# Patient Record
Sex: Male | Born: 1955
Health system: Southern US, Community
[De-identification: ages and names within clinical notes are randomized; demographics above are authoritative.]

## PROBLEM LIST (undated history)

## (undated) DIAGNOSIS — I499 Cardiac arrhythmia, unspecified: Secondary | ICD-10-CM

## (undated) DIAGNOSIS — E785 Hyperlipidemia, unspecified: Secondary | ICD-10-CM

## (undated) DIAGNOSIS — H9190 Unspecified hearing loss, unspecified ear: Secondary | ICD-10-CM

## (undated) DIAGNOSIS — I10 Essential (primary) hypertension: Secondary | ICD-10-CM

## (undated) DIAGNOSIS — F329 Major depressive disorder, single episode, unspecified: Secondary | ICD-10-CM

## (undated) HISTORY — DX: Unspecified hearing loss, unspecified ear: H91.90

## (undated) HISTORY — DX: Hyperlipidemia, unspecified: E78.5

## (undated) HISTORY — DX: Essential (primary) hypertension: I10

## (undated) HISTORY — DX: Major depressive disorder, single episode, unspecified: F32.9

## (undated) HISTORY — DX: Cardiac arrhythmia, unspecified: I49.9

---

## 2013-01-02 DIAGNOSIS — D649 Anemia, unspecified: Secondary | ICD-10-CM

## 2013-01-02 DIAGNOSIS — R002 Palpitations: Secondary | ICD-10-CM

## 2013-01-02 DIAGNOSIS — N12 Tubulo-interstitial nephritis, not specified as acute or chronic: Secondary | ICD-10-CM

## 2013-01-02 DIAGNOSIS — F32A Depression, unspecified: Secondary | ICD-10-CM

## 2013-01-02 DIAGNOSIS — N200 Calculus of kidney: Secondary | ICD-10-CM | POA: Insufficient documentation

## 2013-01-02 DIAGNOSIS — N419 Inflammatory disease of prostate, unspecified: Secondary | ICD-10-CM

## 2013-01-02 HISTORY — DX: Tubulo-interstitial nephritis, not specified as acute or chronic: N12

## 2013-01-02 HISTORY — DX: Palpitations: R00.2

## 2013-01-02 HISTORY — DX: Calculus of kidney: N20.0

## 2013-01-02 HISTORY — DX: Inflammatory disease of prostate, unspecified: N41.9

## 2013-01-02 HISTORY — DX: Depression, unspecified: F32.A

## 2013-01-02 HISTORY — DX: Anemia, unspecified: D64.9

## 2013-01-04 DIAGNOSIS — N400 Enlarged prostate without lower urinary tract symptoms: Secondary | ICD-10-CM

## 2013-01-04 HISTORY — DX: Benign prostatic hyperplasia without lower urinary tract symptoms: N40.0

## 2013-07-02 DIAGNOSIS — F419 Anxiety disorder, unspecified: Secondary | ICD-10-CM

## 2013-07-02 HISTORY — DX: Anxiety disorder, unspecified: F41.9

## 2018-12-09 DIAGNOSIS — I10 Essential (primary) hypertension: Secondary | ICD-10-CM | POA: Diagnosis not present

## 2018-12-09 DIAGNOSIS — Z79899 Other long term (current) drug therapy: Secondary | ICD-10-CM | POA: Diagnosis not present

## 2018-12-09 DIAGNOSIS — Z0000 Encounter for general adult medical examination without abnormal findings: Secondary | ICD-10-CM | POA: Diagnosis not present

## 2018-12-09 DIAGNOSIS — E785 Hyperlipidemia, unspecified: Secondary | ICD-10-CM | POA: Diagnosis not present

## 2019-03-03 DIAGNOSIS — Z79899 Other long term (current) drug therapy: Secondary | ICD-10-CM | POA: Diagnosis not present

## 2019-03-03 DIAGNOSIS — Z9181 History of falling: Secondary | ICD-10-CM | POA: Diagnosis not present

## 2019-03-31 ENCOUNTER — Other Ambulatory Visit: Payer: Self-pay

## 2019-03-31 DIAGNOSIS — T466X5A Adverse effect of antihyperlipidemic and antiarteriosclerotic drugs, initial encounter: Secondary | ICD-10-CM | POA: Diagnosis not present

## 2019-03-31 DIAGNOSIS — E785 Hyperlipidemia, unspecified: Secondary | ICD-10-CM | POA: Diagnosis not present

## 2019-03-31 DIAGNOSIS — Z79899 Other long term (current) drug therapy: Secondary | ICD-10-CM | POA: Diagnosis not present

## 2019-03-31 NOTE — Patient Outreach (Signed)
[facility]) Care Management  03/31/2019  LEUM TOMAN 03/09/1956 [id number]   Medication Adherence call to Mr. Christella Hartigan patient has a disconnected Number,patient is past due on Atorvastatin 20 mg under [place].   [facility] [place] Management Direct Dial [phone]  Fax [phone] [email or web address]

## 2019-05-05 DIAGNOSIS — Z79899 Other long term (current) drug therapy: Secondary | ICD-10-CM | POA: Diagnosis not present

## 2019-05-05 DIAGNOSIS — Z87442 Personal history of urinary calculi: Secondary | ICD-10-CM | POA: Diagnosis not present

## 2019-06-23 DIAGNOSIS — Z87442 Personal history of urinary calculi: Secondary | ICD-10-CM | POA: Diagnosis not present

## 2019-06-23 DIAGNOSIS — Z79899 Other long term (current) drug therapy: Secondary | ICD-10-CM | POA: Diagnosis not present

## 2019-07-28 DIAGNOSIS — Z79899 Other long term (current) drug therapy: Secondary | ICD-10-CM | POA: Diagnosis not present

## 2019-07-28 DIAGNOSIS — E785 Hyperlipidemia, unspecified: Secondary | ICD-10-CM | POA: Diagnosis not present

## 2019-09-02 DIAGNOSIS — Z79899 Other long term (current) drug therapy: Secondary | ICD-10-CM | POA: Diagnosis not present

## 2019-09-02 DIAGNOSIS — H833X3 Noise effects on inner ear, bilateral: Secondary | ICD-10-CM | POA: Diagnosis not present

## 2019-09-02 DIAGNOSIS — H9193 Unspecified hearing loss, bilateral: Secondary | ICD-10-CM | POA: Diagnosis not present

## 2019-09-02 DIAGNOSIS — H9313 Tinnitus, bilateral: Secondary | ICD-10-CM | POA: Diagnosis not present

## 2019-09-14 DIAGNOSIS — R1012 Left upper quadrant pain: Secondary | ICD-10-CM | POA: Diagnosis not present

## 2019-09-14 DIAGNOSIS — Z79899 Other long term (current) drug therapy: Secondary | ICD-10-CM | POA: Diagnosis not present

## 2019-09-14 DIAGNOSIS — Z87442 Personal history of urinary calculi: Secondary | ICD-10-CM | POA: Diagnosis not present

## 2019-09-14 DIAGNOSIS — N200 Calculus of kidney: Secondary | ICD-10-CM | POA: Diagnosis not present

## 2019-09-14 DIAGNOSIS — R1011 Right upper quadrant pain: Secondary | ICD-10-CM | POA: Diagnosis not present

## 2019-09-21 DIAGNOSIS — I700 Atherosclerosis of aorta: Secondary | ICD-10-CM | POA: Diagnosis not present

## 2019-09-21 DIAGNOSIS — K5730 Diverticulosis of large intestine without perforation or abscess without bleeding: Secondary | ICD-10-CM | POA: Diagnosis not present

## 2019-09-21 DIAGNOSIS — N200 Calculus of kidney: Secondary | ICD-10-CM | POA: Diagnosis not present

## 2019-09-22 DIAGNOSIS — N200 Calculus of kidney: Secondary | ICD-10-CM | POA: Diagnosis not present

## 2019-09-22 DIAGNOSIS — R1011 Right upper quadrant pain: Secondary | ICD-10-CM | POA: Diagnosis not present

## 2019-09-22 DIAGNOSIS — R1012 Left upper quadrant pain: Secondary | ICD-10-CM | POA: Diagnosis not present

## 2019-10-11 DIAGNOSIS — N200 Calculus of kidney: Secondary | ICD-10-CM | POA: Diagnosis not present

## 2019-10-11 DIAGNOSIS — Z77122 Contact with and (suspected) exposure to noise: Secondary | ICD-10-CM | POA: Diagnosis not present

## 2019-10-11 DIAGNOSIS — H9313 Tinnitus, bilateral: Secondary | ICD-10-CM | POA: Diagnosis not present

## 2019-10-11 DIAGNOSIS — H903 Sensorineural hearing loss, bilateral: Secondary | ICD-10-CM | POA: Diagnosis not present

## 2019-10-13 DIAGNOSIS — N200 Calculus of kidney: Secondary | ICD-10-CM | POA: Diagnosis not present

## 2019-10-19 DIAGNOSIS — N200 Calculus of kidney: Secondary | ICD-10-CM | POA: Diagnosis not present

## 2019-10-20 DIAGNOSIS — Z1159 Encounter for screening for other viral diseases: Secondary | ICD-10-CM | POA: Diagnosis not present

## 2019-10-20 DIAGNOSIS — N200 Calculus of kidney: Secondary | ICD-10-CM | POA: Diagnosis not present

## 2019-10-22 DIAGNOSIS — N200 Calculus of kidney: Secondary | ICD-10-CM | POA: Diagnosis not present

## 2019-10-28 DIAGNOSIS — N200 Calculus of kidney: Secondary | ICD-10-CM | POA: Diagnosis not present

## 2019-10-29 DIAGNOSIS — N200 Calculus of kidney: Secondary | ICD-10-CM | POA: Diagnosis not present

## 2019-11-17 DIAGNOSIS — I10 Essential (primary) hypertension: Secondary | ICD-10-CM | POA: Diagnosis not present

## 2019-11-17 DIAGNOSIS — Z79899 Other long term (current) drug therapy: Secondary | ICD-10-CM | POA: Diagnosis not present

## 2019-11-17 DIAGNOSIS — E785 Hyperlipidemia, unspecified: Secondary | ICD-10-CM | POA: Diagnosis not present

## 2019-12-15 DIAGNOSIS — Z0000 Encounter for general adult medical examination without abnormal findings: Secondary | ICD-10-CM | POA: Diagnosis not present

## 2019-12-15 DIAGNOSIS — Z9181 History of falling: Secondary | ICD-10-CM | POA: Diagnosis not present

## 2019-12-15 DIAGNOSIS — E785 Hyperlipidemia, unspecified: Secondary | ICD-10-CM | POA: Diagnosis not present

## 2020-01-27 DIAGNOSIS — H43393 Other vitreous opacities, bilateral: Secondary | ICD-10-CM | POA: Diagnosis not present

## 2020-01-27 DIAGNOSIS — H2513 Age-related nuclear cataract, bilateral: Secondary | ICD-10-CM | POA: Diagnosis not present

## 2020-03-15 DIAGNOSIS — Z79899 Other long term (current) drug therapy: Secondary | ICD-10-CM | POA: Diagnosis not present

## 2020-03-15 DIAGNOSIS — E785 Hyperlipidemia, unspecified: Secondary | ICD-10-CM | POA: Diagnosis not present

## 2020-03-15 DIAGNOSIS — I10 Essential (primary) hypertension: Secondary | ICD-10-CM | POA: Diagnosis not present

## 2020-03-15 DIAGNOSIS — Z139 Encounter for screening, unspecified: Secondary | ICD-10-CM | POA: Diagnosis not present

## 2020-07-17 DIAGNOSIS — I499 Cardiac arrhythmia, unspecified: Secondary | ICD-10-CM | POA: Diagnosis not present

## 2020-07-17 DIAGNOSIS — I10 Essential (primary) hypertension: Secondary | ICD-10-CM | POA: Diagnosis not present

## 2020-07-17 DIAGNOSIS — Z79899 Other long term (current) drug therapy: Secondary | ICD-10-CM | POA: Diagnosis not present

## 2020-07-17 DIAGNOSIS — E785 Hyperlipidemia, unspecified: Secondary | ICD-10-CM | POA: Diagnosis not present

## 2020-07-19 ENCOUNTER — Encounter: Payer: Self-pay | Admitting: Cardiology

## 2020-07-19 ENCOUNTER — Encounter: Payer: Self-pay | Admitting: *Deleted

## 2020-07-19 DIAGNOSIS — I10 Essential (primary) hypertension: Secondary | ICD-10-CM | POA: Insufficient documentation

## 2020-07-19 DIAGNOSIS — I499 Cardiac arrhythmia, unspecified: Secondary | ICD-10-CM | POA: Insufficient documentation

## 2020-07-19 DIAGNOSIS — E785 Hyperlipidemia, unspecified: Secondary | ICD-10-CM | POA: Insufficient documentation

## 2020-07-19 DIAGNOSIS — F329 Major depressive disorder, single episode, unspecified: Secondary | ICD-10-CM | POA: Insufficient documentation

## 2020-08-07 ENCOUNTER — Ambulatory Visit: Payer: Self-pay | Admitting: Cardiology

## 2020-08-08 ENCOUNTER — Other Ambulatory Visit: Payer: Self-pay

## 2020-08-08 ENCOUNTER — Ambulatory Visit (INDEPENDENT_AMBULATORY_CARE_PROVIDER_SITE_OTHER): Payer: Medicare Other

## 2020-08-08 ENCOUNTER — Ambulatory Visit: Payer: Medicare Other | Admitting: Cardiology

## 2020-08-08 ENCOUNTER — Encounter: Payer: Self-pay | Admitting: Cardiology

## 2020-08-08 VITALS — BP 152/80 | HR 58 | Ht 68.0 in | Wt 152.4 lb

## 2020-08-08 DIAGNOSIS — R002 Palpitations: Secondary | ICD-10-CM

## 2020-08-08 DIAGNOSIS — R0602 Shortness of breath: Secondary | ICD-10-CM

## 2020-08-08 DIAGNOSIS — I10 Essential (primary) hypertension: Secondary | ICD-10-CM | POA: Diagnosis not present

## 2020-08-08 MED ORDER — HYDROCHLOROTHIAZIDE 12.5 MG PO CAPS: 12.5000 mg | ORAL_CAPSULE | Freq: Every day | ORAL | 3 refills | Status: DC

## 2020-08-08 NOTE — Patient Instructions (Addendum)
Medication Instructions:  Your physician has recommended you make the following change in your medication: START: Hydrochlorothiazide 12.5 mg daily  *If you need a refill on your cardiac medications before your next appointment, please call your pharmacy*   Lab Work: None If you have labs (blood work) drawn today and your tests are completely normal, you will receive your results only by: Marland Kitchen MyChart Message (if you have MyChart) OR . A paper copy in the mail If you have any lab test that is abnormal or we need to change your treatment, we will call you to review the results.   Testing/Procedures: Your physician has requested that you have an echocardiogram. Echocardiography is a painless test that uses sound waves to create images of your heart. It provides your doctor with information about the size and shape of your heart and how well your heart's chambers and valves are working. This procedure takes approximately one hour. There are no restrictions for this procedure.  A zio monitor was ordered today. It will remain on for 14 days. You will then return monitor and event diary in provided box. It takes 1-2 weeks for report to be downloaded and returned to [place]. We will call you with the results. If monitor falls off or has orange flashing light, please call Zio for further instructions.      Follow-Up: At [facility], you and your health needs are our priority.  As part of our continuing mission to provide you with exceptional heart care, we have created designated Provider Care Teams.  These Care Teams include your primary Cardiologist (physician) and Advanced Practice Providers (APPs -  Physician Assistants and Nurse Practitioners) who all work together to provide you with the care you need, when you need it.  We recommend signing up for the patient portal called "MyChart".  Sign up information is provided on this After Visit Summary.  MyChart is used to connect with patients for  Virtual Visits (Telemedicine).  Patients are able to view lab/test results, encounter notes, upcoming appointments, etc.  Non-urgent messages can be sent to your provider as well.   To learn more about what you can do with MyChart, go to [email or web address].    Your next appointment:   3 month(s)  The format for your next appointment:   In Person  Provider:   Berniece Salines, DO   Other Instructions

## 2020-08-08 NOTE — Progress Notes (Signed)
Cardiology Office Note:    Date:  08/08/2020   ID:  Christopher Duarte, DOB 04-22-1956, MRN [id number]  PCP:  Helen Hashimoto., MD  Cardiologist:  Berniece Salines, DO  Electrophysiologist:  None   Referring MD: Helen Hashimoto., MD   Have been having palpitations and I feel like my heart is beating irregularly  History of Present Illness:    Christopher Duarte is a 65 y.o. male with a hx of hypertension, hyperlipidemia, BPH is here today to be evaluated at the request of his PCP.  The patient tells me that he has been experiencing intermittent palpitations.  He notes that he feels his heart is irregular a stress of [place] at that time he feels lightheaded and dizzy.  Sometimes he feels that he is short of breath which gets better.  He has had chest tightness during the time of his palpitations he has not had any syncope episode.  Past Medical History:  Diagnosis Date  . Anemia 01/02/2013  . Anxiety 07/02/2013  . Arrhythmia   . Benign prostatic hyperplasia 01/04/2013  . Depression 01/02/2013  . Hyperlipidemia   . Hypertension   . Kidney stones 01/02/2013  . Major depression   . Palpitation 01/02/2013  . Prostatitis 01/02/2013  . Pyelonephritis 01/02/2013    History reviewed. No pertinent surgical history.  Current Medications: Current Meds  Medication Sig  . atorvastatin (LIPITOR) 10 MG tablet Take 10 mg by mouth daily.  . cyclobenzaprine (FLEXERIL) 10 MG tablet Take 10 mg by mouth as needed for pain.  . hydrochlorothiazide (MICROZIDE) 12.5 MG capsule Take 1 capsule (12.5 mg total) by mouth daily.  . metoprolol tartrate (LOPRESSOR) 25 MG tablet Take 25 mg by mouth 2 (two) times daily.  . QUEtiapine Fumarate (SEROQUEL XR) 150 MG 24 hr tablet Take 150 mg by mouth at bedtime.  . [DISCONTINUED] aspirin EC 81 MG tablet Take 81 mg by mouth daily. Swallow whole.  . [DISCONTINUED] Multiple Vitamin (MULTIVITAMIN) tablet Take 1 tablet by mouth daily.     Allergies:   Patient has no known  allergies.   Social History   Socioeconomic History  . Marital status: Married    Spouse name: Not on file  . Number of children: Not on file  . Years of education: Not on file  . Highest education level: Not on file  Occupational History  . Not on file  Tobacco Use  . Smoking status: Never Smoker  . Smokeless tobacco: Not on file  Substance and Sexual Activity  . Alcohol use: Not on file    Comment: Occasional  . Drug use: Never  . Sexual activity: Not on file  Other Topics Concern  . Not on file  Social History Narrative  . Not on file   Social Determinants of Health   Financial Resource Strain: Not on file  Food Insecurity: Not on file  Transportation Needs: Not on file  Physical Activity: Not on file  Stress: Not on file  Social Connections: Not on file     Family History: The patient's family history includes Prostate cancer in his father; Skin cancer in his mother.  ROS:   Review of Systems  Constitution: Negative for decreased appetite, fever and weight gain.  HENT: Negative for congestion, ear discharge, hoarse voice and sore throat.   Eyes: Negative for discharge, redness, vision loss in right eye and visual halos.  Cardiovascular: Negative for chest pain, dyspnea on exertion, leg swelling, orthopnea and palpitations.  Respiratory: Negative  for cough, hemoptysis, shortness of breath and snoring.   Endocrine: Negative for heat intolerance and polyphagia.  Hematologic/Lymphatic: Negative for bleeding problem. Does not bruise/bleed easily.  Skin: Negative for flushing, nail changes, rash and suspicious lesions.  Musculoskeletal: Negative for arthritis, joint pain, muscle cramps, myalgias, neck pain and stiffness.  Gastrointestinal: Negative for abdominal pain, bowel incontinence, diarrhea and excessive appetite.  Genitourinary: Negative for decreased libido, genital sores and incomplete emptying.  Neurological: Negative for brief paralysis, focal weakness,  headaches and loss of balance.  Psychiatric/Behavioral: Negative for altered mental status, depression and suicidal ideas.  Allergic/Immunologic: Negative for HIV exposure and persistent infections.    EKGs/Labs/Other Studies Reviewed:    The following studies were reviewed today:   EKG:  The ekg ordered today demonstrates sinus bradycardia, heart rate 58 bpm with incomplete right bundle branch block.  Recent Labs: No results found for requested labs within last 8760 hours.  Recent Lipid Panel No results found for: CHOL, TRIG, HDL, CHOLHDL, VLDL, LDLCALC, LDLDIRECT  Physical Exam:    VS:  BP (!) 152/80 (BP Location: Right Arm)   Pulse (!) 58   Ht 5\' 8"  (1.727 m)   Wt 152 lb 6.4 oz (69.1 kg)   SpO2 98%   BMI 23.17 kg/m     Wt Readings from Last 3 Encounters:  08/08/20 152 lb 6.4 oz (69.1 kg)  07/17/20 149 lb (67.6 kg)     GEN: Well nourished, well developed in no acute distress HEENT: Normal NECK: No JVD; No carotid bruits LYMPHATICS: No lymphadenopathy CARDIAC: S1S2 noted,RRR, no murmurs, rubs, gallops RESPIRATORY:  Clear to auscultation without rales, wheezing or rhonchi  ABDOMEN: Soft, non-tender, non-distended, +bowel sounds, no guarding. EXTREMITIES: No edema, No cyanosis, no clubbing MUSCULOSKELETAL:  No deformity  SKIN: Warm and dry NEUROLOGIC:  Alert and oriented x 3, non-focal PSYCHIATRIC:  Normal affect, good insight  ASSESSMENT:    1. Palpitations   2. Shortness of breath   3. Hypertension, unspecified type    PLAN:     I would like to rule out a cardiovascular etiology of this palpitation, therefore at this time I would like to placed a zio patch for 14 days. For his shortness of breath, a transthoracic echocardiogram will be ordered to assess LV/RV function and any structural abnormalities.   He is hypertensive in the office today.  I like to add hydrochlorothiazide 0.5 mg that he is current regimen which includes metoprolol tartrate 25 mg twice a  day.  Hyperlipidemia - continue with current statin medication.  The patient is in agreement with the above plan. The patient left the office in stable condition.  The patient will follow up in 3 months or sooner if needed.   Medication Adjustments/Labs and Tests Ordered: Current medicines are reviewed at length with the patient today.  Concerns regarding medicines are outlined above.  Orders Placed This Encounter  Procedures  . LONG TERM MONITOR (3-14 DAYS)  . EKG 12-Lead  . ECHOCARDIOGRAM COMPLETE   Meds ordered this encounter  Medications  . hydrochlorothiazide (MICROZIDE) 12.5 MG capsule    Sig: Take 1 capsule (12.5 mg total) by mouth daily.    Dispense:  90 capsule    Refill:  3    Patient Instructions  Medication Instructions:  Your physician has recommended you make the following change in your medication: START: Hydrochlorothiazide 12.5 mg daily  *If you need a refill on your cardiac medications before your next appointment, please call your pharmacy*   Lab  Work: None If you have labs (blood work) drawn today and your tests are completely normal, you will receive your results only by: Marland Kitchen MyChart Message (if you have MyChart) OR . A paper copy in the mail If you have any lab test that is abnormal or we need to change your treatment, we will call you to review the results.   Testing/Procedures: Your physician has requested that you have an echocardiogram. Echocardiography is a painless test that uses sound waves to create images of your heart. It provides your doctor with information about the size and shape of your heart and how well your heart's chambers and valves are working. This procedure takes approximately one hour. There are no restrictions for this procedure.  A zio monitor was ordered today. It will remain on for 14 days. You will then return monitor and event diary in provided box. It takes 1-2 weeks for report to be downloaded and returned to [place]. We will  call you with the results. If monitor falls off or has orange flashing light, please call Zio for further instructions.      Follow-Up: At [facility], you and your health needs are our priority.  As part of our continuing mission to provide you with exceptional heart care, we have created designated Provider Care Teams.  These Care Teams include your primary Cardiologist (physician) and Advanced Practice Providers (APPs -  Physician Assistants and Nurse Practitioners) who all work together to provide you with the care you need, when you need it.  We recommend signing up for the patient portal called "MyChart".  Sign up information is provided on this After Visit Summary.  MyChart is used to connect with patients for Virtual Visits (Telemedicine).  Patients are able to view lab/test results, encounter notes, upcoming appointments, etc.  Non-urgent messages can be sent to your provider as well.   To learn more about what you can do with MyChart, go to [email or web address].    Your next appointment:   3 month(s)  The format for your next appointment:   In Person  Provider:   Berniece Salines, DO   Other Instructions      Adopting a Healthy Lifestyle.  Know what a healthy weight is for you (roughly BMI <25) and aim to maintain this   Aim for 7+ servings of fruits and vegetables daily   65-80+ fluid ounces of water or unsweet tea for healthy kidneys   Limit to max 1 drink of alcohol per day; avoid smoking/tobacco   Limit animal fats in diet for cholesterol and heart health - choose grass fed whenever available   Avoid highly processed foods, and foods high in saturated/trans fats   Aim for low stress - take time to unwind and care for your mental health   Aim for 150 min of moderate intensity exercise weekly for heart health, and weights twice weekly for bone health   Aim for 7-9 hours of sleep daily   When it comes to diets, agreement about the perfect plan isnt easy to  find, even among the experts. Experts at the [employer] developed an idea known as the Healthy Eating Plate. Just imagine a plate divided into logical, healthy portions.   The emphasis is on diet quality:   Load up on vegetables and fruits - one-half of your plate: Aim for color and variety, and remember that potatoes dont count.   Go for whole grains - one-quarter of your plate: Whole wheat, barley,  wheat berries, quinoa, oats, brown rice, and foods made with them. If you want pasta, go with whole wheat pasta.   Protein power - one-quarter of your plate: Fish, chicken, beans, and nuts are all healthy, versatile protein sources. Limit red meat.   The diet, however, does go beyond the plate, offering a few other suggestions.   Use healthy plant oils, such as olive, canola, soy, corn, sunflower and peanut. Check the labels, and avoid partially hydrogenated oil, which have unhealthy trans fats.   If youre thirsty, drink water. Coffee and tea are good in moderation, but skip sugary drinks and limit milk and dairy products to one or two daily servings.   The type of carbohydrate in the diet is more important than the amount. Some sources of carbohydrates, such as vegetables, fruits, whole grains, and beans-are healthier than others.   Finally, stay active  Signed, Berniece Salines, DO  08/08/2020 3:14 PM    [facility]

## 2020-08-22 DIAGNOSIS — R002 Palpitations: Secondary | ICD-10-CM

## 2020-09-06 DIAGNOSIS — R002 Palpitations: Secondary | ICD-10-CM | POA: Diagnosis not present

## 2020-09-07 ENCOUNTER — Other Ambulatory Visit: Payer: Self-pay

## 2020-09-07 ENCOUNTER — Ambulatory Visit (INDEPENDENT_AMBULATORY_CARE_PROVIDER_SITE_OTHER): Payer: Medicare Other

## 2020-09-07 DIAGNOSIS — R0602 Shortness of breath: Secondary | ICD-10-CM | POA: Diagnosis not present

## 2020-09-07 LAB — ECHOCARDIOGRAM COMPLETE
Area-P 1/2: 3.61 cm2
S' Lateral: 2.4 cm

## 2020-09-07 NOTE — Progress Notes (Signed)
Complete echocardiogram performed.  Jimmy Cannan Beeck RDCS, RVT  

## 2020-09-13 ENCOUNTER — Other Ambulatory Visit: Payer: Self-pay

## 2020-09-13 MED ORDER — DILTIAZEM HCL ER COATED BEADS 120 MG PO CP24: 120.0000 mg | ORAL_CAPSULE | Freq: Every day | ORAL | 3 refills | Status: DC

## 2020-09-13 MED ORDER — APIXABAN 5 MG PO TABS: 5.0000 mg | ORAL_TABLET | Freq: Two times a day (BID) | ORAL | 3 refills | Status: DC

## 2020-09-13 NOTE — Progress Notes (Signed)
Spoke with patient about his echo results. Patient verbalizes understanding. No further questions or concerns at this time. Patient aware prescription will be sent in for A. Fib control as discussed on the phone.

## 2020-09-14 ENCOUNTER — Ambulatory Visit (INDEPENDENT_AMBULATORY_CARE_PROVIDER_SITE_OTHER): Payer: Medicare Other | Admitting: Cardiology

## 2020-09-14 ENCOUNTER — Other Ambulatory Visit: Payer: Self-pay

## 2020-09-14 ENCOUNTER — Encounter: Payer: Self-pay | Admitting: Cardiology

## 2020-09-14 VITALS — BP 118/62 | HR 60 | Ht 68.0 in | Wt 149.8 lb

## 2020-09-14 DIAGNOSIS — I10 Essential (primary) hypertension: Secondary | ICD-10-CM

## 2020-09-14 DIAGNOSIS — E782 Mixed hyperlipidemia: Secondary | ICD-10-CM | POA: Diagnosis not present

## 2020-09-14 DIAGNOSIS — I480 Paroxysmal atrial fibrillation: Secondary | ICD-10-CM | POA: Diagnosis not present

## 2020-09-14 NOTE — Progress Notes (Signed)
Cardiology Office Note:    Date:  09/14/2020   ID:  Christopher Duarte, DOB 06-Feb-1956, MRN [id number]  PCP:  Helen Hashimoto., MD  Cardiologist:  Berniece Salines, DO  Electrophysiologist:  None   Referring MD: Helen Hashimoto., MD   I am doing good  History of Present Illness:    Christopher Duarte is a 65 y.o. male with a hx of hypertension, hyperlipidemia and BPH presented on August 08, 2020 to be evaluated for palpitations.  At that time I recommended we place a monitor on the patient.  In addition he was hypertensive I added hydrochlorothiazide 12.5 mg to his regimen.  He did wear his monitor as well as get his echocardiogram.  He is here today to discuss his result.  He notes that he feels that when he gets those palpitations that he feels significantly weak and tired.  He notes that is all related to his palpitation symptoms.  Past Medical History:  Diagnosis Date  . Anemia 01/02/2013  . Anxiety 07/02/2013  . Arrhythmia   . Benign prostatic hyperplasia 01/04/2013  . Depression 01/02/2013  . Hyperlipidemia   . Hypertension   . Kidney stones 01/02/2013  . Major depression   . Palpitation 01/02/2013  . Prostatitis 01/02/2013  . Pyelonephritis 01/02/2013    History reviewed. No pertinent surgical history.  Current Medications: Current Meds  Medication Sig  . apixaban (ELIQUIS) 5 MG TABS tablet Take 1 tablet (5 mg total) by mouth 2 (two) times daily.  Marland Kitchen atorvastatin (LIPITOR) 10 MG tablet Take 10 mg by mouth daily.  . cyclobenzaprine (FLEXERIL) 10 MG tablet Take 10 mg by mouth as needed for pain.  Marland Kitchen diltiazem (CARDIZEM CD) 120 MG 24 hr capsule Take 1 capsule (120 mg total) by mouth daily.  . hydrochlorothiazide (MICROZIDE) 12.5 MG capsule Take 1 capsule (12.5 mg total) by mouth daily.  . metoprolol tartrate (LOPRESSOR) 25 MG tablet Take 25 mg by mouth 2 (two) times daily.  . QUEtiapine Fumarate (SEROQUEL XR) 150 MG 24 hr tablet Take 150 mg by mouth at bedtime.     Allergies:    Patient has no known allergies.   Social History   Socioeconomic History  . Marital status: Married    Spouse name: Not on file  . Number of children: Not on file  . Years of education: Not on file  . Highest education level: Not on file  Occupational History  . Not on file  Tobacco Use  . Smoking status: Never Smoker  . Smokeless tobacco: Not on file  Substance and Sexual Activity  . Alcohol use: Not on file    Comment: Occasional  . Drug use: Never  . Sexual activity: Not on file  Other Topics Concern  . Not on file  Social History Narrative  . Not on file   Social Determinants of Health   Financial Resource Strain: Not on file  Food Insecurity: Not on file  Transportation Needs: Not on file  Physical Activity: Not on file  Stress: Not on file  Social Connections: Not on file     Family History: The patient's family history includes Prostate cancer in his father; Skin cancer in his mother.  ROS:   Review of Systems  Constitution: Negative for decreased appetite, fever and weight gain.  HENT: Negative for congestion, ear discharge, hoarse voice and sore throat.   Eyes: Negative for discharge, redness, vision loss in right eye and visual halos.  Cardiovascular: Reports palpitations.  Negative for chest pain, dyspnea on exertion, leg swelling, orthopnea . Respiratory: Negative for cough, hemoptysis, shortness of breath and snoring.   Endocrine: Negative for heat intolerance and polyphagia.  Hematologic/Lymphatic: Negative for bleeding problem. Does not bruise/bleed easily.  Skin: Negative for flushing, nail changes, rash and suspicious lesions.  Musculoskeletal: Negative for arthritis, joint pain, muscle cramps, myalgias, neck pain and stiffness.  Gastrointestinal: Negative for abdominal pain, bowel incontinence, diarrhea and excessive appetite.  Genitourinary: Negative for decreased libido, genital sores and incomplete emptying.  Neurological: Negative for brief  paralysis, focal weakness, headaches and loss of balance.  Psychiatric/Behavioral: Negative for altered mental status, depression and suicidal ideas.  Allergic/Immunologic: Negative for HIV exposure and persistent infections.    EKGs/Labs/Other Studies Reviewed:    The following studies were reviewed today:   EKG: None today  ZIO monitor The patient wore the monitor for 13 days 15  hours starting August 08, 2020. Indication: Palpitations  The minimum heart rate was 31 bpm, maximum heart rate was 70 bpm, and average heart rate was 184bpm. Predominant underlying rhythm was Sinus Rhythm.  Atrial Fibrillation occurred (9% burden), ranging from 56-184 bpm (avg of 94 bpm), the longest lasting 1 hour 37 mins with an avg rate of 97 bpm.  Premature atrial complexes were occasional (1.6%, 20779). Premature Ventricular complexes were rare less than 1%.  No ventricular tachycardia, and no significant pauses.    6 triggered events 5 associated with atrial fibrillation, 6 diary events for socioatrial fibrillation.  Conclusion: This study is remarkable for symptomatic atrial fibrillation (9% burden) and occasional premature atrial complex.   Transthoracic echocardiogram September 07, 2020 IMPRESSIONS    1. Left ventricular ejection fraction, by estimation, is 55 to 60%. The  left ventricle has normal function. The left ventricle has no regional  wall motion abnormalities. Left ventricular diastolic parameters were  normal.  2. Right ventricular systolic function is normal. The right ventricular  size is normal. There is normal pulmonary artery systolic pressure.  3. The mitral valve is normal in structure. Mild mitral valve  regurgitation. No evidence of mitral stenosis.  4. The aortic valve is normal in structure. Aortic valve regurgitation is  not visualized. No aortic stenosis is present.  5. The inferior vena cava is normal in size with greater than 50%  respiratory  variability, suggesting right atrial pressure of 3 mmHg.   FINDINGS  Left Ventricle: Left ventricular ejection fraction, by estimation, is 55  to 60%. The left ventricle has normal function. The left ventricle has no  regional wall motion abnormalities. The left ventricular internal cavity  size was normal in size. There is  borderline left ventricular hypertrophy. Left ventricular diastolic  parameters were normal.   Right Ventricle: The right ventricular size is normal. No increase in  right ventricular wall thickness. Right ventricular systolic function is  normal. There is normal pulmonary artery systolic pressure. The tricuspid  regurgitant velocity is 1.92 m/s, and  with an assumed right atrial pressure of 3 mmHg, the estimated right  ventricular systolic pressure is 54.6 mmHg.   Left Atrium: Left atrial size was normal in size.   Right Atrium: Right atrial size was normal in size.   Pericardium: There is no evidence of pericardial effusion.   Mitral Valve: The mitral valve is normal in structure. Mild mitral valve  regurgitation. No evidence of mitral valve stenosis.   Tricuspid Valve: The tricuspid valve is normal in structure. Tricuspid  valve regurgitation is trivial. No evidence of  tricuspid stenosis.   Aortic Valve: The aortic valve is normal in structure. Aortic valve  regurgitation is not visualized. No aortic stenosis is present.   Pulmonic Valve: The pulmonic valve was normal in structure. Pulmonic valve  regurgitation is not visualized. No evidence of pulmonic stenosis.   Aorta: The aortic root is normal in size and structure.   Venous: The inferior vena cava is normal in size with greater than 50%  respiratory variability, suggesting right atrial pressure of 3 mmHg.   IAS/Shunts: No atrial level shunt detected by color flow Doppler.       Recent Labs: No results found for requested labs within last 8760 hours.  Recent Lipid Panel No results found  for: CHOL, TRIG, HDL, CHOLHDL, VLDL, LDLCALC, LDLDIRECT  Physical Exam:    VS:  BP 118/62   Pulse 60   Ht 5\' 8"  (1.727 m)   Wt 149 lb 12.8 oz (67.9 kg)   SpO2 98%   BMI 22.78 kg/m     Wt Readings from Last 3 Encounters:  09/14/20 149 lb 12.8 oz (67.9 kg)  08/08/20 152 lb 6.4 oz (69.1 kg)  07/17/20 149 lb (67.6 kg)     GEN: Well nourished, well developed in no acute distress HEENT: Normal NECK: No JVD; No carotid bruits LYMPHATICS: No lymphadenopathy CARDIAC: S1S2 noted,RRR, no murmurs, rubs, gallops RESPIRATORY:  Clear to auscultation without rales, wheezing or rhonchi  ABDOMEN: Soft, non-tender, non-distended, +bowel sounds, no guarding. EXTREMITIES: No edema, No cyanosis, no clubbing MUSCULOSKELETAL:  No deformity  SKIN: Warm and dry NEUROLOGIC:  Alert and oriented x 3, non-focal PSYCHIATRIC:  Normal affect, good insight  ASSESSMENT:    1. PAF (paroxysmal atrial fibrillation) ([place])   2. Hypertension, unspecified type   3. Mixed hyperlipidemia    PLAN:    I discussed this new diagnosis with him today.  He does have paroxysmal atrial fibrillation.  I explained to the patient what this means.  Given his chads vas score is 2 and is on a risk of score is 2.2 anticoagulation is indicated in this patient.  He is agreeable to take Eliquis.  We will start him on Eliquis 5 mg twice a day.  We will also get a baseline CBC for him given his history of anemia.  He may be a great candidate for A. fib ablation but I referred the patient to EP for assessment for this.  For now he prefers to stay on the metoprolol we will keep you on his metoprolol 25 mg daily his heart rate today is controlled.  He has responded very well to the addition to his blood pressure medication with the hydrochlorothiazide.  No changes will be made.   Continue his diet and medication.  The patient is in agreement with the above plan. The patient left the office in stable condition.  The patient will follow  up in   Medication Adjustments/Labs and Tests Ordered: Current medicines are reviewed at length with the patient today.  Concerns regarding medicines are outlined above.  Orders Placed This Encounter  Procedures  . CBC  . Ambulatory referral to Cardiac Electrophysiology   No orders of the defined types were placed in this encounter.   Patient Instructions  Medication Instructions:  Your physician has recommended you make the following change in your medication:  1. START ELIQUIS 5 MG TWO TIMES DAILY *If you need a refill on your cardiac medications before your next appointment, please call your pharmacy*   Lab Work:  Your physician recommends that you return for lab work in: [place]  If you have labs (blood work) drawn today and your tests are completely normal, you will receive your results only by: Marland Kitchen MyChart Message (if you have MyChart) OR . A paper copy in the mail If you have any lab test that is abnormal or we need to change your treatment, we will call you to review the results.   Testing/Procedures: NONE   Follow-Up: At [facility], you and your health needs are our priority.  As part of our continuing mission to provide you with exceptional heart care, we have created designated Provider Care Teams.  These Care Teams include your primary Cardiologist (physician) and Advanced Practice Providers (APPs -  Physician Assistants and Nurse Practitioners) who all work together to provide you with the care you need, when you need it.  We recommend signing up for the patient portal called "MyChart".  Sign up information is provided on this After Visit Summary.  MyChart is used to connect with patients for Virtual Visits (Telemedicine).  Patients are able to view lab/test results, encounter notes, upcoming appointments, etc.  Non-urgent messages can be sent to your provider as well.   To learn more about what you can do with MyChart, go to [email or web address].    Your  next appointment:   Keep June Appointment   The format for your next appointment:     Provider:   Berniece Salines, DO   Other Instructions Referral to Dr. Curt Bears     Adopting a Healthy Lifestyle.  Know what a healthy weight is for you (roughly BMI <25) and aim to maintain this   Aim for 7+ servings of fruits and vegetables daily   65-80+ fluid ounces of water or unsweet tea for healthy kidneys   Limit to max 1 drink of alcohol per day; avoid smoking/tobacco   Limit animal fats in diet for cholesterol and heart health - choose grass fed whenever available   Avoid highly processed foods, and foods high in saturated/trans fats   Aim for low stress - take time to unwind and care for your mental health   Aim for 150 min of moderate intensity exercise weekly for heart health, and weights twice weekly for bone health   Aim for 7-9 hours of sleep daily   When it comes to diets, agreement about the perfect plan isnt easy to find, even among the experts. Experts at the [employer] developed an idea known as the Healthy Eating Plate. Just imagine a plate divided into logical, healthy portions.   The emphasis is on diet quality:   Load up on vegetables and fruits - one-half of your plate: Aim for color and variety, and remember that potatoes dont count.   Go for whole grains - one-quarter of your plate: Whole wheat, barley, wheat berries, quinoa, oats, brown rice, and foods made with them. If you want pasta, go with whole wheat pasta.   Protein power - one-quarter of your plate: Fish, chicken, beans, and nuts are all healthy, versatile protein sources. Limit red meat.   The diet, however, does go beyond the plate, offering a few other suggestions.   Use healthy plant oils, such as olive, canola, soy, corn, sunflower and peanut. Check the labels, and avoid partially hydrogenated oil, which have unhealthy trans fats.   If youre thirsty, drink water. Coffee and  tea are good in moderation, but skip sugary drinks and limit milk  and dairy products to one or two daily servings.   The type of carbohydrate in the diet is more important than the amount. Some sources of carbohydrates, such as vegetables, fruits, whole grains, and beans-are healthier than others.   Finally, stay active  Signed, Berniece Salines, DO  09/14/2020 11:03 AM    [facility]

## 2020-09-14 NOTE — Patient Instructions (Signed)
Medication Instructions:  Your physician has recommended you make the following change in your medication:  1. START ELIQUIS 5 MG TWO TIMES DAILY *If you need a refill on your cardiac medications before your next appointment, please call your pharmacy*   Lab Work: Your physician recommends that you return for lab work in: [place]  If you have labs (blood work) drawn today and your tests are completely normal, you will receive your results only by: Marland Kitchen MyChart Message (if you have MyChart) OR . A paper copy in the mail If you have any lab test that is abnormal or we need to change your treatment, we will call you to review the results.   Testing/Procedures: NONE   Follow-Up: At [facility], you and your health needs are our priority.  As part of our continuing mission to provide you with exceptional heart care, we have created designated Provider Care Teams.  These Care Teams include your primary Cardiologist (physician) and Advanced Practice Providers (APPs -  Physician Assistants and Nurse Practitioners) who all work together to provide you with the care you need, when you need it.  We recommend signing up for the patient portal called "MyChart".  Sign up information is provided on this After Visit Summary.  MyChart is used to connect with patients for Virtual Visits (Telemedicine).  Patients are able to view lab/test results, encounter notes, upcoming appointments, etc.  Non-urgent messages can be sent to your provider as well.   To learn more about what you can do with MyChart, go to [email or web address].    Your next appointment:   Keep June Appointment   The format for your next appointment:     Provider:   Berniece Salines, DO   Other Instructions Referral to Dr. Curt Bears

## 2020-09-15 LAB — CBC
Hematocrit: 44.3 % (ref 37.5–51.0)
Hemoglobin: 15.2 g/dL (ref 13.0–17.7)
MCH: 30.2 pg (ref 26.6–33.0)
MCHC: 34.3 g/dL (ref 31.5–35.7)
MCV: 88 fL (ref 79–97)
Platelets: 228 10*3/uL (ref 150–450)
RBC: 5.04 x10E6/uL (ref 4.14–5.80)
RDW: 12.7 % (ref 11.6–15.4)
WBC: 5.5 10*3/uL (ref 3.4–10.8)

## 2020-10-19 ENCOUNTER — Other Ambulatory Visit: Payer: Self-pay

## 2020-10-19 ENCOUNTER — Ambulatory Visit: Payer: Medicare Other | Admitting: Cardiology

## 2020-10-19 ENCOUNTER — Encounter: Payer: Self-pay | Admitting: Cardiology

## 2020-10-19 VITALS — BP 148/82 | HR 76 | Ht 68.5 in | Wt 150.0 lb

## 2020-10-19 DIAGNOSIS — I480 Paroxysmal atrial fibrillation: Secondary | ICD-10-CM

## 2020-10-19 DIAGNOSIS — Z01818 Encounter for other preprocedural examination: Secondary | ICD-10-CM

## 2020-10-19 DIAGNOSIS — Z01812 Encounter for preprocedural laboratory examination: Secondary | ICD-10-CM

## 2020-10-19 NOTE — Progress Notes (Signed)
Electrophysiology Office Note   Date:  10/19/2020   ID:  Christopher Duarte, DOB 1956-01-03, MRN [id number]  PCP:  Helen Hashimoto., MD  Cardiologist:  Tobb Primary Electrophysiologist:  Kristy Schomburg Meredith Leeds, MD    Chief Complaint: AF   History of Present Illness: Christopher Duarte is a 65 y.o. male who is being seen today for the evaluation of AF at the request of Tobb, Kardie, DO. Presenting today for electrophysiology evaluation.  He has a history sending for hypertension, hyperlipidemia, BPH.  He had been having intermittent palpitations.  He is also had lightheadedness and dizziness.  He wore a cardiac monitor that showed episodes of atrial fibrillation with a 9% burden.  Today, he denies symptoms of palpitations, chest pain, shortness of breath, orthopnea, PND, lower extremity edema, claudication, dizziness, presyncope, syncope, bleeding, or neurologic sequela. The patient is tolerating medications without difficulties.  He continues to have episodes of atrial fibrillation.  He does feel better since starting his rate controlling medications.  He has weakness and fatigue as well as shortness of breath from his atrial fibrillation.  He also notes significant palpitations.  There are no exacerbating or alleviating factors.  He does state that he is on Seroquel for sleep and depression.  He is aware that antiarrhythmics do interact with Seroquel.   Past Medical History:  Diagnosis Date  . Anemia 01/02/2013  . Anxiety 07/02/2013  . Arrhythmia   . Benign prostatic hyperplasia 01/04/2013  . Depression 01/02/2013  . Hyperlipidemia   . Hypertension   . Kidney stones 01/02/2013  . Major depression   . Palpitation 01/02/2013  . Prostatitis 01/02/2013  . Pyelonephritis 01/02/2013   History reviewed. No pertinent surgical history.   Current Outpatient Medications  Medication Sig Dispense Refill  . apixaban (ELIQUIS) 5 MG TABS tablet Take 1 tablet (5 mg total) by mouth 2 (two) times daily. 180  tablet 3  . atorvastatin (LIPITOR) 10 MG tablet Take 10 mg by mouth daily.    . cyclobenzaprine (FLEXERIL) 10 MG tablet Take 10 mg by mouth as needed for pain.    Marland Kitchen diltiazem (CARDIZEM CD) 120 MG 24 hr capsule Take 1 capsule (120 mg total) by mouth daily. 90 capsule 3  . hydrochlorothiazide (MICROZIDE) 12.5 MG capsule Take 1 capsule (12.5 mg total) by mouth daily. 90 capsule 3  . QUEtiapine Fumarate (SEROQUEL XR) 150 MG 24 hr tablet Take 150 mg by mouth at bedtime.     No current facility-administered medications for this visit.    Allergies:   Patient has no known allergies.   Social History:  The patient  reports that he has never smoked. He does not have any smokeless tobacco history on file. He reports that he does not use drugs.   Family History:  The patient's family history includes Prostate cancer in his father; Skin cancer in his mother.    ROS:  Please see the history of present illness.   Otherwise, review of systems is positive for none.   All other systems are reviewed and negative.    PHYSICAL EXAM: VS:  BP (!) 148/82   Pulse 76   Ht 5' 8.5" (1.74 m)   Wt 150 lb (68 kg)   BMI 22.48 kg/m  , BMI Body mass index is 22.48 kg/m. GEN: Well nourished, well developed, in no acute distress  HEENT: normal  Neck: no JVD, carotid bruits, or masses Cardiac: RRR; no murmurs, rubs, or gallops,no edema  Respiratory:  clear to  auscultation bilaterally, normal work of breathing GI: soft, nontender, nondistended, + BS MS: no deformity or atrophy  Skin: warm and dry Neuro:  Strength and sensation are intact Psych: euthymic mood, full affect  EKG:  EKG is ordered today. Personal review of the ekg ordered shows sinus rhythm, rate 76  Recent Labs: 09/14/2020: Hemoglobin 15.2; Platelets 228    Lipid Panel  No results found for: CHOL, TRIG, HDL, CHOLHDL, VLDL, LDLCALC, LDLDIRECT   Wt Readings from Last 3 Encounters:  10/19/20 150 lb (68 kg)  09/14/20 149 lb 12.8 oz (67.9 kg)   08/08/20 152 lb 6.4 oz (69.1 kg)      Other studies Reviewed: Additional studies/ records that were reviewed today include: TTE 09/07/20  Review of the above records today demonstrates:  1. Left ventricular ejection fraction, by estimation, is 55 to 60%. The  left ventricle has normal function. The left ventricle has no regional  wall motion abnormalities. Left ventricular diastolic parameters were  normal.  2. Right ventricular systolic function is normal. The right ventricular  size is normal. There is normal pulmonary artery systolic pressure.  3. The mitral valve is normal in structure. Mild mitral valve  regurgitation. No evidence of mitral stenosis.  4. The aortic valve is normal in structure. Aortic valve regurgitation is  not visualized. No aortic stenosis is present.  5. The inferior vena cava is normal in size with greater than 50%  respiratory variability, suggesting right atrial pressure of 3 mmHg.   Cardiac monitor 09/12/2020 personally reviewed This study is remarkable for symptomatic atrial fibrillation (9% burden) and occasional premature atrial complex.   ASSESSMENT AND PLAN:  1.  Paroxysmal atrial fibrillation: CHA2DS2-VASc of 2.  Currently on Eliquis and metoprolol.  He is quite bothered by his atrial fibrillation with weakness, fatigue, shortness of breath, and palpitations.  We did discuss further options of therapy including antiarrhythmic medications.  He is on Seroquel and would have quite a few medication interactions.  At this point, he would prefer ablation.  We did discuss risks and benefits.  We [name] not get a CT or TEE as he is strictly paroxysmal.  Risk, benefits, and alternatives to EP study and radiofrequency ablation for afib were also discussed in detail today. These risks include but are not limited to stroke, bleeding, vascular damage, tamponade, perforation, damage to the esophagus, lungs, and other structures, pulmonary vein stenosis, worsening  renal function, and death. The patient understands these risk and wishes to proceed.  We [name] therefore proceed with catheter ablation at the next available time.  Carto, ICE, anesthesia are requested for the procedure.    2.  Hypertension: Mildly elevated today.  Plan per primary cardiology.  Was normal on his most recent visit.  Case discussed with primary cardiology  Current medicines are reviewed at length with the patient today.   The patient does not have concerns regarding his medicines.  The following changes were made today:  none  Labs/ tests ordered today include:  Orders Placed This Encounter  Procedures  . Basic metabolic panel  . CBC  . EKG 12-Lead     Disposition:   FU with Donterrius Santucci 3 months  Signed, Harli Engelken Meredith Leeds, MD  10/19/2020 10:48 AM     [facility] [address] [place] [id number] [phone] (office) [phone] (fax)

## 2020-10-19 NOTE — Patient Instructions (Signed)
Medication Instructions:  Your physician recommends that you continue on your current medications as directed. Please refer to the Current Medication list given to you today.  *If you need a refill on your cardiac medications before your next appointment, please call your pharmacy*   Lab Work: Pre procedure labs between 6/13 - 6/17 at the [place] office:  BMP & CBC If you have labs (blood work) drawn today and your tests are completely normal, you will receive your results only by: Marland Kitchen MyChart Message (if you have MyChart) OR . A paper copy in the mail If you have any lab test that is abnormal or we need to change your treatment, we will call you to review the results.   Testing/Procedures: Your physician has recommended that you have an ablation. Catheter ablation is a medical procedure used to treat some cardiac arrhythmias (irregular heartbeats). During catheter ablation, a long, thin, flexible tube is put into a blood vessel in your groin (upper thigh), or neck. This tube is called an ablation catheter. It is then guided to your heart through the blood vessel. Radio frequency waves destroy small areas of heart tissue where abnormal heartbeats may cause an arrhythmia to start. Please follow instruction below located under "other instructions".   Follow-Up: At [facility], you and your health needs are our priority.  As part of our continuing mission to provide you with exceptional heart care, we have created designated Provider Care Teams.  These Care Teams include your primary Cardiologist (physician) and Advanced Practice Providers (APPs -  Physician Assistants and Nurse Practitioners) who all work together to provide you with the care you need, when you need it.  Your next appointment:   1 month(s) after your ablation  The format for your next appointment:   In Person  Provider:   AFib clinic   Thank you for choosing [facility]!!   Trinidad Curet, RN [phone]    Other Instructions   Electrophysiology/Ablation Procedure Instructions   You are scheduled for a(n)  ablation on 11/29/2020 with Dr. Allegra Lai.   1.   Pre procedure testing-             A.  LAB WORK --- On 11/29/2020  for your pre procedure blood work.  You do NOT need to be fasting.  You can stop by the [place] office (avoid lunch time hours)                B. COVID TEST-- On 11/27/2020 @ 2:00 pm -  This is a Drive Up Visit at [address] [place], [place] [address].  Someone will direct you to the appropriate testing line. Stay in your car and someone will be with you shortly.   After you are tested please go home and self quarantine until the day of your procedure.     2. On the day of your procedure 11/29/2020 you will go to [facility] [phone]) at 9:30 am.  Dennis Bast will go to the main entrance A [employer]) and enter where the [employer] are.  Your driver will drop you off and you will head down the hallway to ADMITTING.  You may have one support person come in to the hospital with you.  They will be asked to wait in the waiting room. It is OK to have someone drop you off and come back when you are ready to be discharged.   3.   Do not eat  or drink after midnight prior to your procedure.   4.   On the morning of your procedure do NOT take any medication. Do not miss any doses of your blood thinner prior to the morning of your procedure or your procedure will need to be rescheduled.   5.  Plan for an overnight stay but you may be discharged after your procedure, if you use your phone frequently bring your phone charger. If you are discharged after your procedure you will need someone to drive you home and be with you for 24 hours after your procedure.   6. You will follow up with the AFIB clinic 4 weeks after your procedure.  You will follow up with Dr. Curt Bears  3 months after your procedure.  These appointments will be made for you.   * If you  have ANY questions please call the office [phone] and ask for Tiburcio Linder RN or send me a MyChart message   * Occasionally, EP Studies and ablations can become lengthy.  Please make your family aware of this before your procedure starts.  Average time ranges from 2-8 hours for EP studies/ablations.  Your physician will call your family after the procedure with the results.                                     AFIB CLINIC INFORMATION: Your appointment is scheduled on: ____________ at ___________. Please arrive 15 minutes early for check-in. The AFib Clinic is located in the Heart and Vascular Specialty Clinics at [facility]. Parking instructions/directions: [address] (off [address]). When you pull in to Entrance C, there is an underground parking garage to your right. The code to enter the garage is _____________. Take the elevators to the first floor. Follow the signs to the Heart and Vascular Specialty Clinics. You will see registration at the end of the hallway.  Phone number: [phone]    Cardiac Ablation Cardiac ablation is a procedure to destroy (ablate) some heart tissue that is sending bad signals. These bad signals cause problems in heart rhythm. The heart has many areas that make these signals. If there are problems in these areas, they can make the heart beat in a way that is not normal. Destroying some tissues can help make the heart rhythm normal. Tell your doctor about:  Any allergies you have.  All medicines you are taking. These include vitamins, herbs, eye drops, creams, and over-the-counter medicines.  Any problems you or family members have had with medicines that make you fall asleep (anesthetics).  Any blood disorders you have.  Any surgeries you have had.  Any medical conditions you have, such as kidney failure.  Whether you are pregnant or may be pregnant. What are the risks? This is a safe procedure. But problems may occur,  including:  Infection.  Bruising and bleeding.  Bleeding into the chest.  Stroke or blood clots.  Damage to nearby areas of your body.  Allergies to medicines or dyes.  The need for a pacemaker if the normal system is damaged.  Failure of the procedure to treat the problem. What happens before the procedure? Medicines Ask your doctor about:  Changing or stopping your normal medicines. This is important.  Taking aspirin and ibuprofen. Do not take these medicines unless your doctor tells you to take them.  Taking other medicines, vitamins, herbs, and supplements. General instructions  Follow  instructions from your doctor about what you cannot eat or drink.  Plan to have someone take you home from the hospital or clinic.  If you will be going home right after the procedure, plan to have someone with you for 24 hours.  Ask your doctor what steps will be taken to prevent infection. What happens during the procedure?  An IV tube will be put into one of your veins.  You will be given a medicine to help you relax.  The skin on your neck or groin will be numbed.  A cut (incision) will be made in your neck or groin. A needle will be put through your cut and into a large vein.  A tube (catheter) will be put into the needle. The tube will be moved to your heart.  Dye may be put through the tube. This helps your doctor see your heart.  Small devices (electrodes) on the tube will send out signals.  A type of energy will be used to destroy some heart tissue.  The tube will be taken out.  Pressure will be held on your cut. This helps stop bleeding.  A bandage will be put over your cut. The exact procedure may vary among doctors and hospitals.   What happens after the procedure?  You will be watched until you leave the hospital or clinic. This includes checking your heart rate, breathing rate, oxygen, and blood pressure.  Your cut will be watched for bleeding. You will  need to lie still for a few hours.  Do not drive for 24 hours or as long as your doctor tells you. Summary  Cardiac ablation is a procedure to destroy some heart tissue. This is done to treat heart rhythm problems.  Tell your doctor about any medical conditions you may have. Tell him or her about all medicines you are taking to treat them.  This is a safe procedure. But problems may occur. These include infection, bruising, bleeding, and damage to nearby areas of your body.  Follow what your doctor tells you about food and drink. You may also be told to change or stop some of your medicines.  After the procedure, do not drive for 24 hours or as long as your doctor tells you. This information is not intended to replace advice given to you by your health care provider. Make sure you discuss any questions you have with your health care provider. Document Revised: 04/29/2019 Document Reviewed: 04/29/2019 Elsevier Patient Education  2021 [employer].

## 2020-11-08 ENCOUNTER — Ambulatory Visit: Payer: Medicare Other | Admitting: Cardiology

## 2020-11-15 ENCOUNTER — Telehealth: Payer: Self-pay | Admitting: *Deleted

## 2020-11-15 DIAGNOSIS — I4819 Other persistent atrial fibrillation: Secondary | ICD-10-CM

## 2020-11-15 NOTE — Telephone Encounter (Signed)
Informed that he will need a CT prior to ablation (no longer shortage issue for the hospital), aware hospital will call to schedule this testing. Pt made aware covid screening is no longer required.  Advised that if pt comes in contact with someone w/ covid and/or becomes symptomatic to call office to discuss.  He will stop by the [facility] office Monday for lab work. Aware procedure time has moved up and he needs to arrive at 6:30 am day of ablation. Patient verbalized understanding and agreeable to plan.

## 2020-11-20 DIAGNOSIS — Z01812 Encounter for preprocedural laboratory examination: Secondary | ICD-10-CM | POA: Diagnosis not present

## 2020-11-20 DIAGNOSIS — I480 Paroxysmal atrial fibrillation: Secondary | ICD-10-CM | POA: Diagnosis not present

## 2020-11-21 LAB — CBC
Hematocrit: 47.2 % (ref 37.5–51.0)
Hemoglobin: 15.6 g/dL (ref 13.0–17.7)
MCH: 30.1 pg (ref 26.6–33.0)
MCHC: 33.1 g/dL (ref 31.5–35.7)
MCV: 91 fL (ref 79–97)
Platelets: 234 10*3/uL (ref 150–450)
RBC: 5.19 x10E6/uL (ref 4.14–5.80)
RDW: 13.2 % (ref 11.6–15.4)
WBC: 4.9 10*3/uL (ref 3.4–10.8)

## 2020-11-21 LAB — BASIC METABOLIC PANEL
BUN/Creatinine Ratio: 14 (ref 10–24)
BUN: 15 mg/dL (ref 8–27)
CO2: 24 mmol/L (ref 20–29)
Calcium: 9.5 mg/dL (ref 8.6–10.2)
Chloride: 98 mmol/L (ref 96–106)
Creatinine, Ser: 1.04 mg/dL (ref 0.76–1.27)
Glucose: 95 mg/dL (ref 65–99)
Potassium: 3.6 mmol/L (ref 3.5–5.2)
Sodium: 139 mmol/L (ref 134–144)
eGFR: 79 mL/min/{1.73_m2} (ref >59)

## 2020-11-22 ENCOUNTER — Telehealth (HOSPITAL_COMMUNITY): Payer: Self-pay | Admitting: Emergency Medicine

## 2020-11-22 DIAGNOSIS — I10 Essential (primary) hypertension: Secondary | ICD-10-CM | POA: Diagnosis not present

## 2020-11-22 DIAGNOSIS — Z79899 Other long term (current) drug therapy: Secondary | ICD-10-CM | POA: Diagnosis not present

## 2020-11-22 DIAGNOSIS — E785 Hyperlipidemia, unspecified: Secondary | ICD-10-CM | POA: Diagnosis not present

## 2020-11-22 NOTE — Telephone Encounter (Signed)
Reaching out to patient to offer assistance regarding upcoming cardiac imaging study; pt verbalizes understanding of appt date/time, parking situation and where to check in, pre-test NPO status and medications ordered, and verified current allergies; name and call back number provided for further questions should they arise Delorice Bannister RN Navigator Cardiac Imaging  Heart and Vascular [phone] office [phone] cell 

## 2020-11-23 ENCOUNTER — Ambulatory Visit (HOSPITAL_COMMUNITY)
Admission: RE | Admit: 2020-11-23 | Discharge: 2020-11-23 | Disposition: A | Discharge status: Home / Self Care | Payer: Medicare Other | Source: Ambulatory Visit | Attending: Cardiology | Admitting: Cardiology

## 2020-11-23 ENCOUNTER — Encounter (HOSPITAL_COMMUNITY): Payer: Self-pay

## 2020-11-23 ENCOUNTER — Other Ambulatory Visit: Payer: Self-pay

## 2020-11-23 DIAGNOSIS — I4819 Other persistent atrial fibrillation: Secondary | ICD-10-CM | POA: Diagnosis not present

## 2020-11-23 MED ORDER — IOHEXOL 350 MG/ML SOLN
80.0000 mL | Freq: Once | INTRAVENOUS | Status: AC | PRN
  Administered 2020-11-23: 80 mL via INTRAVENOUS

## 2020-11-23 MED ORDER — LIDOCAINE VISCOUS HCL 2 % MT SOLN
OROMUCOSAL | Status: AC
  Filled 2020-11-23: qty 15

## 2020-11-27 ENCOUNTER — Other Ambulatory Visit (HOSPITAL_COMMUNITY): Payer: Medicare Other

## 2020-11-28 NOTE — Pre-Procedure Instructions (Signed)
Attempted to call patient regarding procedure instructions for procedure tomorrow.  Left voice mail on the following items:  Arrival time 0630 Nothing to eat or drink after midnight No meds AM of procedure Responsible person to drive you home and stay with you for 24 hrs  Have you missed any doses of anti-coagulant Eliquis- take both doses today and none in the morning

## 2020-11-29 ENCOUNTER — Encounter (HOSPITAL_COMMUNITY)
Admission: RE | Disposition: A | Discharge status: Home / Self Care | Payer: Self-pay | Source: Home / Self Care | Attending: Cardiology

## 2020-11-29 ENCOUNTER — Ambulatory Visit (HOSPITAL_COMMUNITY): Payer: Medicare Other | Admitting: Anesthesiology

## 2020-11-29 ENCOUNTER — Ambulatory Visit (HOSPITAL_COMMUNITY)
Admission: RE | Admit: 2020-11-29 | Discharge: 2020-11-29 | Disposition: A | Discharge status: Home / Self Care | Payer: Medicare Other | Attending: Cardiology | Admitting: Cardiology

## 2020-11-29 ENCOUNTER — Other Ambulatory Visit: Payer: Self-pay

## 2020-11-29 DIAGNOSIS — I10 Essential (primary) hypertension: Secondary | ICD-10-CM | POA: Insufficient documentation

## 2020-11-29 DIAGNOSIS — E782 Mixed hyperlipidemia: Secondary | ICD-10-CM | POA: Diagnosis not present

## 2020-11-29 DIAGNOSIS — E785 Hyperlipidemia, unspecified: Secondary | ICD-10-CM | POA: Diagnosis not present

## 2020-11-29 DIAGNOSIS — I480 Paroxysmal atrial fibrillation: Secondary | ICD-10-CM | POA: Insufficient documentation

## 2020-11-29 HISTORY — PX: ATRIAL FIBRILLATION ABLATION: EP1191

## 2020-11-29 LAB — POCT ACTIVATED CLOTTING TIME
Activated Clotting Time: 335 seconds
Activated Clotting Time: 387 seconds

## 2020-11-29 SURGERY — ATRIAL FIBRILLATION ABLATION
Anesthesia: General

## 2020-11-29 MED ORDER — ROCURONIUM BROMIDE 10 MG/ML (PF) SYRINGE
PREFILLED_SYRINGE | INTRAVENOUS | Status: DC | PRN
  Administered 2020-11-29: 40 mg via INTRAVENOUS
  Administered 2020-11-29: 60 mg via INTRAVENOUS

## 2020-11-29 MED ORDER — APIXABAN 5 MG PO TABS
5.0000 mg | ORAL_TABLET | ORAL | Status: AC
  Administered 2020-11-29: 5 mg via ORAL
  Filled 2020-11-29: qty 1

## 2020-11-29 MED ORDER — SODIUM CHLORIDE 0.9 % IV SOLN: 250.0000 mL | INTRAVENOUS | Status: DC | PRN

## 2020-11-29 MED ORDER — DOBUTAMINE IN D5W 4-5 MG/ML-% IV SOLN
INTRAVENOUS | Status: AC
  Filled 2020-11-29: qty 250

## 2020-11-29 MED ORDER — SUGAMMADEX SODIUM 200 MG/2ML IV SOLN
INTRAVENOUS | Status: DC | PRN
  Administered 2020-11-29: 300 mg via INTRAVENOUS

## 2020-11-29 MED ORDER — ONDANSETRON HCL 4 MG/2ML IJ SOLN: 4.0000 mg | Freq: Four times a day (QID) | INTRAMUSCULAR | Status: DC | PRN

## 2020-11-29 MED ORDER — PROPOFOL 10 MG/ML IV BOLUS
INTRAVENOUS | Status: DC | PRN
  Administered 2020-11-29: 140 mg via INTRAVENOUS

## 2020-11-29 MED ORDER — PROTAMINE SULFATE 10 MG/ML IV SOLN
INTRAVENOUS | Status: DC | PRN
  Administered 2020-11-29: 40 mg via INTRAVENOUS

## 2020-11-29 MED ORDER — MIDAZOLAM HCL 2 MG/2ML IJ SOLN
INTRAMUSCULAR | Status: DC | PRN
  Administered 2020-11-29: 2 mg via INTRAVENOUS

## 2020-11-29 MED ORDER — SODIUM CHLORIDE 0.9 % IV SOLN: INTRAVENOUS | Status: DC

## 2020-11-29 MED ORDER — HEPARIN SODIUM (PORCINE) 1000 UNIT/ML IJ SOLN
INTRAMUSCULAR | Status: DC | PRN
  Administered 2020-11-29: 14000 [IU] via INTRAVENOUS

## 2020-11-29 MED ORDER — APIXABAN 5 MG PO TABS
5.0000 mg | ORAL_TABLET | ORAL | Status: DC
  Filled 2020-11-29: qty 1

## 2020-11-29 MED ORDER — ONDANSETRON HCL 4 MG/2ML IJ SOLN
INTRAMUSCULAR | Status: DC | PRN
  Administered 2020-11-29: 4 mg via INTRAVENOUS

## 2020-11-29 MED ORDER — HEPARIN (PORCINE) IN NACL 1000-0.9 UT/500ML-% IV SOLN
INTRAVENOUS | Status: DC | PRN
  Administered 2020-11-29 (×5): 500 mL

## 2020-11-29 MED ORDER — ACETAMINOPHEN 325 MG PO TABS
650.0000 mg | ORAL_TABLET | ORAL | Status: DC | PRN
  Filled 2020-11-29: qty 2

## 2020-11-29 MED ORDER — PHENYLEPHRINE HCL-NACL 10-0.9 MG/250ML-% IV SOLN
INTRAVENOUS | Status: DC | PRN
  Administered 2020-11-29: 50 ug/min via INTRAVENOUS

## 2020-11-29 MED ORDER — HEPARIN SODIUM (PORCINE) 1000 UNIT/ML IJ SOLN
INTRAMUSCULAR | Status: AC
  Filled 2020-11-29: qty 1

## 2020-11-29 MED ORDER — HEPARIN SODIUM (PORCINE) 1000 UNIT/ML IJ SOLN
INTRAMUSCULAR | Status: DC | PRN
  Administered 2020-11-29: 1000 [IU] via INTRAVENOUS

## 2020-11-29 MED ORDER — LIDOCAINE 2% (20 MG/ML) 5 ML SYRINGE
INTRAMUSCULAR | Status: DC | PRN
  Administered 2020-11-29: 80 mg via INTRAVENOUS

## 2020-11-29 MED ORDER — SODIUM CHLORIDE 0.9% FLUSH: 3.0000 mL | INTRAVENOUS | Status: DC | PRN

## 2020-11-29 MED ORDER — FENTANYL CITRATE (PF) 250 MCG/5ML IJ SOLN
INTRAMUSCULAR | Status: DC | PRN
  Administered 2020-11-29 (×2): 50 ug via INTRAVENOUS

## 2020-11-29 MED ORDER — DOBUTAMINE IN D5W 4-5 MG/ML-% IV SOLN
INTRAVENOUS | Status: DC | PRN
  Administered 2020-11-29: 20 ug/kg/min via INTRAVENOUS

## 2020-11-29 MED ORDER — ACETAMINOPHEN 500 MG PO TABS
1000.0000 mg | ORAL_TABLET | Freq: Once | ORAL | Status: AC
  Administered 2020-11-29: 1000 mg via ORAL
  Filled 2020-11-29: qty 2

## 2020-11-29 MED ORDER — APIXABAN 5 MG PO TABS
5.0000 mg | ORAL_TABLET | Freq: Two times a day (BID) | ORAL | Status: DC
  Filled 2020-11-29: qty 1

## 2020-11-29 MED ORDER — DEXAMETHASONE SODIUM PHOSPHATE 10 MG/ML IJ SOLN
INTRAMUSCULAR | Status: DC | PRN
  Administered 2020-11-29: 5 mg via INTRAVENOUS

## 2020-11-29 SURGICAL SUPPLY — 21 items
BLANKET WARM UNDERBOD FULL ACC (MISCELLANEOUS) ×3 IMPLANT
CATH 8FR REPROCESSED SOUNDSTAR (CATHETERS) ×3 IMPLANT
CATH MAPPNG PENTARAY F 2-6-2MM (CATHETERS) ×1 IMPLANT
CATH S CIRCA THERM PROBE 10F (CATHETERS) ×3 IMPLANT
CATH SMTCH THERMOCOOL SF DF (CATHETERS) ×3 IMPLANT
CATH WEB BI DIR CSDF CRV REPRO (CATHETERS) ×3 IMPLANT
CLOSURE PERCLOSE PROSTYLE (VASCULAR PRODUCTS) ×12 IMPLANT
COVER SWIFTLINK CONNECTOR (BAG) ×3 IMPLANT
KIT VERSACROSS STEERABLE D1 (CATHETERS) ×3 IMPLANT
MAT PREVALON FULL STRYKER (MISCELLANEOUS) ×3 IMPLANT
PACK EP LATEX FREE (CUSTOM PROCEDURE TRAY) ×2
PACK EP LF (CUSTOM PROCEDURE TRAY) ×1 IMPLANT
PAD PRO RADIOLUCENT 2001M-C (PAD) ×3 IMPLANT
PATCH CARTO3 (PAD) ×3 IMPLANT
PENTARAY F 2-6-2MM (CATHETERS) ×3
SHEATH CARTO VIZIGO SM CVD (SHEATH) ×3 IMPLANT
SHEATH PINNACLE 7F 10CM (SHEATH) ×3 IMPLANT
SHEATH PINNACLE 8F 10CM (SHEATH) ×6 IMPLANT
SHEATH PINNACLE 9F 10CM (SHEATH) ×3 IMPLANT
SHEATH PROBE COVER 6X72 (BAG) ×3 IMPLANT
TUBING SMART ABLATE COOLFLOW (TUBING) ×3 IMPLANT

## 2020-11-29 NOTE — Anesthesia Postprocedure Evaluation (Signed)
Anesthesia Post Note  Patient: Christopher Duarte  Procedure(s) Performed: ATRIAL FIBRILLATION ABLATION     Patient location during evaluation: Phase II Anesthesia Type: General Level of consciousness: awake and alert, patient cooperative and oriented Pain management: pain level controlled Vital Signs Assessment: post-procedure vital signs reviewed and stable Respiratory status: spontaneous breathing, nonlabored ventilation and respiratory function stable Cardiovascular status: blood pressure returned to baseline and stable Postop Assessment: no apparent nausea or vomiting (nausea resolved) Anesthetic complications: no   No notable events documented.  Last Vitals:  Vitals:   11/29/20 1225 11/29/20 1240  BP: (!) 148/79 (!) 152/77  Pulse: 77 78  Resp: 16 19  Temp:    SpO2: 96% 94%    Last Pain:  Vitals:   11/29/20 1200  TempSrc:   PainSc: 0-No pain                 Shemiah Rosch,E. Malaky Tetrault

## 2020-11-29 NOTE — H&P (Signed)
Electrophysiology Office Note   Date:  11/29/2020   ID:  Christopher Duarte, DOB 1955-06-15, MRN [id number]  PCP:  Helen Hashimoto., MD  Cardiologist:  [place] Primary Electrophysiologist:  Florrie Ramires Meredith Leeds, MD    Chief Complaint: AF   History of Present Illness: Christopher Duarte is a 65 y.o. male who is being seen today for the evaluation of AF at the request of No ref. provider found. Presenting today for electrophysiology evaluation.  He has a history sending for hypertension, hyperlipidemia, BPH.  He had been having intermittent palpitations.  He is also had lightheadedness and dizziness.  He wore a cardiac monitor that showed episodes of atrial fibrillation with a 9% burden.  Today, denies symptoms of palpitations, chest pain, shortness of breath, orthopnea, PND, lower extremity edema, claudication, dizziness, presyncope, syncope, bleeding, or neurologic sequela. The patient is tolerating medications without difficulties. Plan ablation today.    Past Medical History:  Diagnosis Date   Anemia 01/02/2013   Anxiety 07/02/2013   Arrhythmia    Benign prostatic hyperplasia 01/04/2013   Depression 01/02/2013   Hyperlipidemia    Hypertension    Kidney stones 01/02/2013   Major depression    Palpitation 01/02/2013   Prostatitis 01/02/2013   Pyelonephritis 01/02/2013   No past surgical history on file.   Current Facility-Administered Medications  Medication Dose Route Frequency Provider Last Rate Last Admin   0.9 %  sodium chloride infusion   Intravenous Continuous Constance Haw, MD 50 mL/hr at 11/29/20 0733 New Bag at 11/29/20 9390    Allergies:   Patient has no known allergies.   Social History:  The patient  reports that he does not use drugs.   Family History:  The patient's family history includes Prostate cancer in his father; Skin cancer in his mother.   ROS:  Please see the history of present illness.   Otherwise, review of systems is positive for none.   All other  systems are reviewed and negative.   PHYSICAL EXAM: VS:  BP (!) 156/77   Pulse 62   Temp 97.8 F (36.6 C) (Oral)   Ht 5' 8.5" (1.74 m)   Wt 66.7 kg   SpO2 100%   BMI 22.03 kg/m  , BMI Body mass index is 22.03 kg/m. GEN: Well nourished, well developed, in no acute distress  HEENT: normal  Neck: no JVD, carotid bruits, or masses Cardiac: RRR; no murmurs, rubs, or gallops,no edema  Respiratory:  clear to auscultation bilaterally, normal work of breathing GI: soft, nontender, nondistended, + BS MS: no deformity or atrophy  Skin: warm and dry Neuro:  Strength and sensation are intact Psych: euthymic mood, full affect  Recent Labs: 11/20/2020: BUN 15; Creatinine, Ser 1.04; Hemoglobin 15.6; Platelets 234; Potassium 3.6; Sodium 139    Lipid Panel  No results found for: CHOL, TRIG, HDL, CHOLHDL, VLDL, LDLCALC, LDLDIRECT   Wt Readings from Last 3 Encounters:  11/29/20 66.7 kg  10/19/20 68 kg  09/14/20 67.9 kg      Other studies Reviewed: Additional studies/ records that were reviewed today include: TTE 09/07/20  Review of the above records today demonstrates:   1. Left ventricular ejection fraction, by estimation, is 55 to 60%. The  left ventricle has normal function. The left ventricle has no regional  wall motion abnormalities. Left ventricular diastolic parameters were  normal.   2. Right ventricular systolic function is normal. The right ventricular  size is normal. There is normal pulmonary artery systolic  pressure.   3. The mitral valve is normal in structure. Mild mitral valve  regurgitation. No evidence of mitral stenosis.   4. The aortic valve is normal in structure. Aortic valve regurgitation is  not visualized. No aortic stenosis is present.   5. The inferior vena cava is normal in size with greater than 50%  respiratory variability, suggesting right atrial pressure of 3 mmHg.   Cardiac monitor 09/12/2020 personally reviewed This study is remarkable for  symptomatic atrial fibrillation (9% burden) and occasional premature atrial complex.   ASSESSMENT AND PLAN:  1.  Paroxysmal atrial fibrillation: Christopher Duarte has presented today for surgery, with the diagnosis of atrial fibrillaiton.  The various methods of treatment have been discussed with the patient and family. After consideration of risks, benefits and other options for treatment, the patient has consented to  Procedure(s): Catheter ablation as a surgical intervention .  Risks include but not limited to complete heart block, stroke, esophageal damage, nerve damage, bleeding, vascular damage, tamponade, perforation, MI, and death. The patient's history has been reviewed, patient examined, no change in status, stable for surgery.  I have reviewed the patient's chart and labs.  Questions were answered to the patient's satisfaction.    Gilberto Stanforth Curt Bears, MD 11/29/2020 8:01 AM

## 2020-11-29 NOTE — Transfer of Care (Signed)
Immediate Anesthesia Transfer of Care Note  Patient: Christopher Duarte  Procedure(s) Performed: ATRIAL FIBRILLATION ABLATION  Patient Location: Cath Lab  Anesthesia Type:General  Level of Consciousness: drowsy and patient cooperative  Airway & Oxygen Therapy: Patient Spontanous Breathing  Post-op Assessment: Report given to RN and Post -op Vital signs reviewed and stable  Post vital signs: Reviewed and stable  Last Vitals:  Vitals Value Taken Time  BP 112/62 11/29/20 1100  Temp 36.3 C 11/29/20 1100  Pulse 70 11/29/20 1104  Resp 0 11/29/20 1104  SpO2 95 % 11/29/20 1104  Vitals shown include unvalidated device data.  Last Pain:  Vitals:   11/29/20 1047  TempSrc:   PainSc: 0-No pain         Complications: No notable events documented.

## 2020-11-29 NOTE — Anesthesia Preprocedure Evaluation (Addendum)
Anesthesia Evaluation  Patient identified by MRN, date of birth, ID band Patient awake    Reviewed: Allergy & Precautions, NPO status , Patient's Chart, lab work & pertinent test results  Airway Mallampati: II  TM Distance: >3 FB Neck ROM: Full    Dental  (+) Dental Advisory Given, Upper Dentures   Pulmonary neg pulmonary ROS,    Pulmonary exam normal breath sounds clear to auscultation       Cardiovascular hypertension, Pt. on medications (-) angina(-) Past MI + dysrhythmias Atrial Fibrillation  Rhythm:Irregular Rate:Abnormal     Neuro/Psych PSYCHIATRIC DISORDERS Anxiety Depression negative neurological ROS     GI/Hepatic negative GI ROS, Neg liver ROS,   Endo/Other  negative endocrine ROS  Renal/GU negative Renal ROS     Musculoskeletal negative musculoskeletal ROS (+)   Abdominal   Peds  Hematology  (+) Blood dyscrasia (Eliquis), ,   Anesthesia Other Findings Day of surgery medications reviewed with the patient.  Reproductive/Obstetrics                            Anesthesia Physical Anesthesia Plan  ASA: 3  Anesthesia Plan: General   Post-op Pain Management:    Induction: Intravenous  PONV Risk Score and Plan: 2 and Dexamethasone and Ondansetron  Airway Management Planned: Oral ETT  Additional Equipment:   Intra-op Plan:   Post-operative Plan: Extubation in OR  Informed Consent: I have reviewed the patients History and Physical, chart, labs and discussed the procedure including the risks, benefits and alternatives for the proposed anesthesia with the patient or authorized representative who has indicated his/her understanding and acceptance.     Dental advisory given  Plan Discussed with: CRNA  Anesthesia Plan Comments:         Anesthesia Quick Evaluation

## 2020-11-29 NOTE — Anesthesia Procedure Notes (Signed)
Procedure Name: Intubation Date/Time: 11/29/2020 8:50 AM Performed by: Lance Coon, CRNA Pre-anesthesia Checklist: Patient identified, Emergency Drugs available, Suction available, Patient being monitored and Timeout performed Patient Re-evaluated:Patient Re-evaluated prior to induction Oxygen Delivery Method: Circle system utilized Preoxygenation: Pre-oxygenation with 100% oxygen Induction Type: IV induction Ventilation: Mask ventilation without difficulty Laryngoscope Size: Miller and 3 Grade View: Grade I Tube type: Oral Tube size: 7.5 mm Number of attempts: 1 Airway Equipment and Method: Stylet Placement Confirmation: ETT inserted through vocal cords under direct vision, positive ETCO2 and breath sounds checked- equal and bilateral Secured at: 22 cm Tube secured with: Tape Dental Injury: Teeth and Oropharynx as per pre-operative assessment

## 2020-11-29 NOTE — Discharge Instructions (Addendum)
Post procedure care instructions No driving for 4 days. No lifting over 5 lbs for 1 week. No vigorous or sexual activity for 1 week. You may return to work/your usual activities on 12/07/20. Keep procedure site clean & dry. If you notice increased pain, swelling, bleeding or pus, call/return!  You may shower after 24 hours, but no soaking in baths/hot tubs/pools for 1 week.     You have an appointment set up with the [facility].  Multiple studies have shown that being followed by a dedicated atrial fibrillation clinic in addition to the standard care you receive from your other physicians improves health. We believe that enrollment in the atrial fibrillation clinic will allow [place] to better care for you.   The phone number to the [facility] is [phone]. The clinic is staffed Monday through Friday from 8:30am to 5pm.  Parking Directions: The clinic is located in the Heart and Vascular Building connected to [facility]. 1)From [address] turn on to [address] and go to the 3rd entrance  (Heart and Vascular entrance) on the right. 2)Look to the right for Heart &Vascular Parking Garage. 3)A code for the entrance is required, for July is 3342.   4)Take the elevators to the 1st floor. Registration is in the room with the glass walls at the end of the hallway.  If you have any trouble parking or locating the clinic, please don't hesitate to call [phone] Cardiac Ablation, Care After  This sheet gives you information about how to care for yourself after your procedure. Your health care provider may also give you more specific instructions. If you have problems or questions, contact your health care provider. What can I expect after the procedure? After the procedure, it is common to have: Bruising around your puncture site. Tenderness around your puncture site. Skipped heartbeats. Tiredness (fatigue).  Follow these instructions at  home: Puncture site care  Follow instructions from your health care provider about how to take care of your puncture site. Make sure you: If present, leave stitches (sutures), skin glue, or adhesive strips in place. These skin closures may need to stay in place for up to 2 weeks. If adhesive strip edges start to loosen and curl up, you may trim the loose edges. Do not remove adhesive strips completely unless your health care provider tells you to do that. If a large square bandage is present, this may be removed 24 hours after surgery.  Check your puncture site every day for signs of infection. Check for: Redness, swelling, or pain. Fluid or blood. If your puncture site starts to bleed, lie down on your back, apply firm pressure to the area, and contact your health care provider. Warmth. Pus or a bad smell. Driving Do not drive for at least 4 days after your procedure or however long your health care provider recommends. (Do not resume driving if you have previously been instructed not to drive for other health reasons.) Do not drive or use heavy machinery while taking prescription pain medicine. Activity Avoid activities that take a lot of effort for at least 7 days after your procedure. Do not lift anything that is heavier than 5 lb (4.5 kg) for one week.  No sexual activity for 1 week.  Return to your normal activities as told by your health care provider. Ask your health care provider what activities are safe for you. General instructions Take over-the-counter and prescription medicines only as told by your health care provider.  Do not use any products that contain nicotine or tobacco, such as cigarettes and e-cigarettes. If you need help quitting, ask your health care provider. You may shower after 24 hours, but Do not take baths, swim, or use a hot tub for 1 week.  Do not drink alcohol for 24 hours after your procedure. Keep all follow-up visits as told by your health care provider. This  is important. Contact a health care provider if: You have redness, mild swelling, or pain around your puncture site. You have fluid or blood coming from your puncture site that stops after applying firm pressure to the area. Your puncture site feels warm to the touch. You have pus or a bad smell coming from your puncture site. You have a fever. You have chest pain or discomfort that spreads to your neck, jaw, or arm. You are sweating a lot. You feel nauseous. You have a fast or irregular heartbeat. You have shortness of breath. You are dizzy or light-headed and feel the need to lie down. You have pain or numbness in the arm or leg closest to your puncture site. Get help right away if: Your puncture site suddenly swells. Your puncture site is bleeding and the bleeding does not stop after applying firm pressure to the area. These symptoms may represent a serious problem that is an emergency. Do not wait to see if the symptoms will go away. Get medical help right away. Call your local emergency services (911 in the [place] Do not drive yourself to the hospital. Summary After the procedure, it is normal to have bruising and tenderness at the puncture site in your groin, neck, or forearm. Check your puncture site every day for signs of infection. Get help right away if your puncture site is bleeding and the bleeding does not stop after applying firm pressure to the area. This is a medical emergency. This information is not intended to replace advice given to you by your health care provider. Make sure you discuss any questions you have with your health care provider.  Marland Kitchen

## 2020-11-30 ENCOUNTER — Encounter (HOSPITAL_COMMUNITY): Payer: Self-pay | Admitting: Cardiology

## 2020-12-19 DIAGNOSIS — Z9181 History of falling: Secondary | ICD-10-CM | POA: Diagnosis not present

## 2020-12-19 DIAGNOSIS — E785 Hyperlipidemia, unspecified: Secondary | ICD-10-CM | POA: Diagnosis not present

## 2020-12-19 DIAGNOSIS — Z0000 Encounter for general adult medical examination without abnormal findings: Secondary | ICD-10-CM | POA: Diagnosis not present

## 2020-12-27 ENCOUNTER — Ambulatory Visit (HOSPITAL_COMMUNITY): Payer: Medicare Other | Admitting: Physician Assistant

## 2021-01-04 ENCOUNTER — Ambulatory Visit (HOSPITAL_COMMUNITY): Payer: Medicare Other | Admitting: Physician Assistant

## 2021-01-05 ENCOUNTER — Telehealth: Payer: Self-pay | Admitting: Cardiology

## 2021-01-05 NOTE — Telephone Encounter (Signed)
Spoke with patient regarding blood pressures  Blood pressures started to go down after ablation 6/22 but today first time SBP in the 80's but has been running in the 90's. HR running 70's-90's Blood pressure comes up during the day.  Takes Diltiazem 120 mg HS and HCTZ 12.5 mg in am Per patient no Afib  Was scheduled to see Afib clinic this week however had to cancel.  Discussed with Overton Mam NP and will have patient hold HCTZ, continue to monitor and call on call if any concerns over weekend. Call Monday with update on how he is doing  Advised patient, verbalized understanding.  Afib clinic appointment 8/8

## 2021-01-05 NOTE — Telephone Encounter (Signed)
Pt c/o BP issue: STAT if pt c/o blurred vision, one-sided weakness or slurred speech  1. What are your last 5 BP readings? This morn 12 am 89/44/95, 3:50 am 80/44/84, 6:18 am 104/61/75, 10:55 am 121/69/85, last night 6:53 pm 117/64/91  2. Are you having any other symptoms (ex. Dizziness, headache, blurred vision, passed out)? Feels weak, does not feel like doing anything  3. What is your BP issue? Patient states her BP has been low. She would like a call back from [place] to discuss it. She states she called the afib clinic, but has not heard back.

## 2021-01-08 NOTE — Telephone Encounter (Signed)
Pt c/o BP issue: STAT if pt c/o blurred vision, one-sided weakness or slurred speech  1. What are your last 5 BP readings?  01/08/21 5:28 am: 128/58 HR 78 01/07/21 8:53 pm: 134/65 HR 76 01/07/21 10:16 am: 112/62 HR 81 01/07/21 12:54 am: 138/67 HR 84 01/06/21 2:15 am: 112/58 HR 87  2. Are you having any other symptoms (ex. Dizziness, headache, blurred vision, passed out)?   3. What is your BP issue? Patient was told to stop medication (hydrochlorothiazide (MICROZIDE) 12.5 MG capsule) , record BP and report back to [place] on Monday and that is the reason why he called

## 2021-01-08 NOTE — Telephone Encounter (Signed)
Spoke to the patient just now and let him know Dr. Terrial Rhodes recommendations. He verbalizes understanding.

## 2021-01-15 ENCOUNTER — Other Ambulatory Visit: Payer: Self-pay

## 2021-01-15 ENCOUNTER — Ambulatory Visit (HOSPITAL_COMMUNITY)
Admission: RE | Admit: 2021-01-15 | Discharge: 2021-01-15 | Disposition: A | Discharge status: Home / Self Care | Payer: Medicare Other | Source: Ambulatory Visit | Attending: Physician Assistant | Admitting: Physician Assistant

## 2021-01-15 ENCOUNTER — Encounter (HOSPITAL_COMMUNITY): Payer: Self-pay | Admitting: Physician Assistant

## 2021-01-15 VITALS — BP 134/78 | HR 83 | Ht 68.5 in | Wt 146.6 lb

## 2021-01-15 DIAGNOSIS — E785 Hyperlipidemia, unspecified: Secondary | ICD-10-CM | POA: Insufficient documentation

## 2021-01-15 DIAGNOSIS — I480 Paroxysmal atrial fibrillation: Secondary | ICD-10-CM

## 2021-01-15 DIAGNOSIS — D6869 Other thrombophilia: Secondary | ICD-10-CM | POA: Diagnosis not present

## 2021-01-15 DIAGNOSIS — I10 Essential (primary) hypertension: Secondary | ICD-10-CM | POA: Insufficient documentation

## 2021-01-15 DIAGNOSIS — Z79899 Other long term (current) drug therapy: Secondary | ICD-10-CM | POA: Diagnosis not present

## 2021-01-15 DIAGNOSIS — Z7901 Long term (current) use of anticoagulants: Secondary | ICD-10-CM | POA: Insufficient documentation

## 2021-01-15 NOTE — Progress Notes (Signed)
Primary Care Physician: Helen Hashimoto., MD Primary Cardiologist: Dr Harriet Masson Primary Electrophysiologist: Dr Curt Bears  Referring Physician: Dr Carlye Grippe is a 65 y.o. male with a history of HLD, HTN, and atrial fibrillation who presents for follow up in the [facility].  The patient was initially diagnosed with atrial fibrillation on a heart monitor 08/2020 with a 9% burden after presenting with palpitations. Patient is on Eliquis for a CHADS2VASC score of 2. Patient is s/p afib ablation with Dr Curt Bears on 11/29/20. Patient reports that he has done well since then with no heart racing or palpitations. He denies any CP, swallowing pain, or groin issues.   Today, he denies symptoms of palpitations, chest pain, shortness of breath, orthopnea, PND, lower extremity edema, dizziness, presyncope, syncope, snoring, daytime somnolence, bleeding, or neurologic sequela. The patient is tolerating medications without difficulties and is otherwise without complaint today.    Atrial Fibrillation Risk Factors:  he does not have symptoms or diagnosis of sleep apnea. he does not have a history of rheumatic fever.   he has a BMI of Body mass index is 21.97 kg/m.Marland Kitchen Filed Weights   01/15/21 1447  Weight: 66.5 kg    Family History  Problem Relation Age of Onset   Skin cancer Mother    Prostate cancer Father      Atrial Fibrillation Management history:  Previous antiarrhythmic drugs: none Previous cardioversions: none Previous ablations: 11/29/20 CHADS2VASC score: 2 Anticoagulation history: Eliquis   Past Medical History:  Diagnosis Date   Anemia 01/02/2013   Anxiety 07/02/2013   Arrhythmia    Benign prostatic hyperplasia 01/04/2013   Depression 01/02/2013   Hyperlipidemia    Hypertension    Kidney stones 01/02/2013   Major depression    Palpitation 01/02/2013   Prostatitis 01/02/2013   Pyelonephritis 01/02/2013   Past Surgical History:  Procedure  Laterality Date   ATRIAL FIBRILLATION ABLATION N/A 11/29/2020   Procedure: ATRIAL FIBRILLATION ABLATION;  Surgeon: Constance Haw, MD;  Location: [facility];  Service: Cardiovascular;  Laterality: N/A;    Current Outpatient Medications  Medication Sig Dispense Refill   apixaban (ELIQUIS) 5 MG TABS tablet Take 1 tablet (5 mg total) by mouth 2 (two) times daily. 180 tablet 3   atorvastatin (LIPITOR) 10 MG tablet Take 10 mg by mouth daily.     cyclobenzaprine (FLEXERIL) 10 MG tablet Take 10 mg by mouth at bedtime as needed for pain or muscle spasms.     diltiazem (CARDIZEM CD) 120 MG 24 hr capsule Take 1 capsule (120 mg total) by mouth daily. 90 capsule 3   Glycerin-Hypromellose-PEG 400 (DRY EYE RELIEF DROPS OP) Place 1 drop into both eyes daily as needed (dry eyes).     ibuprofen (ADVIL) 200 MG tablet Take 400 mg by mouth every 6 (six) hours as needed for headache or moderate pain.     No current facility-administered medications for this encounter.    No Known Allergies  Social History   Socioeconomic History   Marital status: Married    Spouse name: Not on file   Number of children: Not on file   Years of education: Not on file   Highest education level: Not on file  Occupational History   Not on file  Tobacco Use   Smoking status: Never   Smokeless tobacco: Never  Substance and Sexual Activity   Alcohol use: Not Currently    Comment: Occasional   Drug  use: Never   Sexual activity: Not on file  Other Topics Concern   Not on file  Social History Narrative   Not on file   Social Determinants of Health   Financial Resource Strain: Not on file  Food Insecurity: Not on file  Transportation Needs: Not on file  Physical Activity: Not on file  Stress: Not on file  Social Connections: Not on file  Intimate Partner Violence: Not on file     ROS- All systems are reviewed and negative except as per the HPI above.  Physical Exam: Vitals:   01/15/21 1447  BP:  134/78  Pulse: 83  Weight: 66.5 kg  Height: 5' 8.5" (1.74 m)    GEN- The patient is a well appearing male, alert and oriented x 3 today.   Head- normocephalic, atraumatic Eyes-  Sclera clear, conjunctiva pink Ears- hearing intact Oropharynx- clear Neck- supple  Lungs- Clear to ausculation bilaterally, normal work of breathing Heart- Regular rate and rhythm, no murmurs, rubs or gallops  GI- soft, NT, ND, + BS Extremities- no clubbing, cyanosis, or edema MS- no significant deformity or atrophy Skin- no rash or lesion Psych- euthymic mood, full affect Neuro- strength and sensation are intact  Wt Readings from Last 3 Encounters:  01/15/21 66.5 kg  11/29/20 66.7 kg  10/19/20 68 kg    EKG today demonstrates  SR Vent. rate 83 BPM PR interval 170 ms QRS duration 100 ms QT/QTcB 364/427 ms  Echo 09/07/20 demonstrated  1. Left ventricular ejection fraction, by estimation, is 55 to 60%. The  left ventricle has normal function. The left ventricle has no regional  wall motion abnormalities. Left ventricular diastolic parameters were  normal.   2. Right ventricular systolic function is normal. The right ventricular  size is normal. There is normal pulmonary artery systolic pressure.   3. The mitral valve is normal in structure. Mild mitral valve  regurgitation. No evidence of mitral stenosis.   4. The aortic valve is normal in structure. Aortic valve regurgitation is  not visualized. No aortic stenosis is present.   5. The inferior vena cava is normal in size with greater than 50%  respiratory variability, suggesting right atrial pressure of 3 mmHg.   Epic records are reviewed at length today  CHA2DS2-VASc Score = 2  The patient's score is based upon: CHF History: No HTN History: Yes Diabetes History: No Stroke History: No Vascular Disease History: No Age Score: 1 Gender Score: 0      ASSESSMENT AND PLAN: 1. Paroxysmal Atrial Fibrillation (ICD10:  I48.0) The patient's  CHA2DS2-VASc score is 2, indicating a 2.2% annual risk of stroke.   S/p afib ablation 11/29/20 Patient appears to be maintaining SR. Continue Eliquis 5 mg BID with no missed doses for at least 3 months post ablation. Continue diltiazem 120 mg daily  2. Secondary Hypercoagulable State (ICD10:  D68.69) The patient is at significant risk for stroke/thromboembolism based upon his CHA2DS2-VASc Score of 2.  Continue Apixaban (Eliquis).    3. HTN Stable, no changes today.   Follow up with Dr Curt Bears as scheduled.    [facility] [address] [place], [place] [address] [phone] 01/15/2021 2:58 PM

## 2021-03-05 ENCOUNTER — Ambulatory Visit: Payer: Medicare Other | Admitting: Cardiology

## 2021-03-05 ENCOUNTER — Encounter: Payer: Self-pay | Admitting: Cardiology

## 2021-03-05 ENCOUNTER — Other Ambulatory Visit: Payer: Self-pay

## 2021-03-05 VITALS — BP 130/78 | HR 83 | Ht 68.5 in | Wt 147.2 lb

## 2021-03-05 DIAGNOSIS — I480 Paroxysmal atrial fibrillation: Secondary | ICD-10-CM | POA: Diagnosis not present

## 2021-03-05 NOTE — Patient Instructions (Signed)
Medication Instructions:  Your physician recommends that you continue on your current medications as directed. Please refer to the Current Medication list given to you today.  *If you need a refill on your cardiac medications before your next appointment, please call your pharmacy*   Lab Work: None ordered   Testing/Procedures: None ordered   Follow-Up: At [facility], you and your health needs are our priority.  As part of our continuing mission to provide you with exceptional heart care, we have created designated Provider Care Teams.  These Care Teams include your primary Cardiologist (physician) and Advanced Practice Providers (APPs -  Physician Assistants and Nurse Practitioners) who all work together to provide you with the care you need, when you need it.  Your next appointment:   3 month(s)  The format for your next appointment:   In Person  Provider:   Will Camnitz, MD    Thank you for choosing [facility]!!   Vishnu Moeller, RN [phone]     

## 2021-03-05 NOTE — Progress Notes (Signed)
Electrophysiology Office Note   Date:  03/05/2021   ID:  Christopher Duarte, DOB [birth year], MRN [id number]  PCP:  Christopher Duarte., MD  Cardiologist:  Christopher Duarte Primary Electrophysiologist:  Christopher Lolli Meredith Leeds, MD    Chief Complaint: AF   History of Present Illness: Christopher Duarte is a 65 y.o. male who is being seen today for the evaluation of AF at the request of Christopher Duarte., MD. Presenting today for electrophysiology evaluation.  He has a history significant for hypertension, hyperlipidemia, BPH.  He was having intermittent palpitations as well as lightheadedness and dizziness.  He wore a cardiac monitor that showed atrial fibrillation with a burden of 9%.  He is now status post ablation 11/29/2020.  Today, denies symptoms of palpitations, chest pain, shortness of breath, orthopnea, PND, lower extremity edema, claudication, dizziness, presyncope, syncope, bleeding, or neurologic sequela. The patient is tolerating medications without difficulties.  Since his ablation he has done well.  He has not had any further episodes of chest pain or shortness of breath.  He is able to do all of his daily activities without restriction.  He has intermittent palpitations that last a second or 2, but he does not feel that these are due to atrial fibrillation.  He is overall happy with his control.   Past Medical History:  Diagnosis Date   Anemia 01/02/2013   Anxiety 07/02/2013   Arrhythmia    Benign prostatic hyperplasia 01/04/2013   Depression 01/02/2013   Hyperlipidemia    Hypertension    Kidney stones 01/02/2013   Major depression    Palpitation 01/02/2013   Prostatitis 01/02/2013   Pyelonephritis 01/02/2013   Past Surgical History:  Procedure Laterality Date   ATRIAL FIBRILLATION ABLATION N/A 11/29/2020   Procedure: ATRIAL FIBRILLATION ABLATION;  Surgeon: Christopher Haw, MD;  Location: [facility];  Service: Cardiovascular;  Laterality: N/A;     Current Outpatient  Medications  Medication Sig Dispense Refill   apixaban (ELIQUIS) 5 MG TABS tablet Take 1 tablet (5 mg total) by mouth 2 (two) times daily. 180 tablet 3   atorvastatin (LIPITOR) 10 MG tablet Take 10 mg by mouth daily.     cyclobenzaprine (FLEXERIL) 10 MG tablet Take 10 mg by mouth at bedtime as needed for pain or muscle spasms.     Glycerin-Hypromellose-PEG 400 (DRY EYE RELIEF DROPS OP) Place 1 drop into both eyes daily as needed (dry eyes).     ibuprofen (ADVIL) 200 MG tablet Take 400 mg by mouth every 6 (six) hours as needed for headache or moderate pain.     diltiazem (CARDIZEM CD) 120 MG 24 hr capsule Take 1 capsule (120 mg total) by mouth daily. 90 capsule 3   No current facility-administered medications for this visit.    Allergies:   Patient has no known allergies.   Social History:  The patient  reports that he has never smoked. He has never used smokeless tobacco. He reports that he does not currently use alcohol. He reports that he does not use drugs.   Family History:  The patient's family history includes Prostate cancer in his father; Skin cancer in his mother.   ROS:  Please see the history of present illness.   Otherwise, review of systems is positive for none.   All other systems are reviewed and negative.   PHYSICAL EXAM: VS:  BP 130/78   Pulse 83   Ht 5' 8.5" (1.74 m)   Wt 147 lb 3.2 oz (  66.8 kg)   SpO2 98%   BMI 22.06 kg/m  , BMI Body mass index is 22.06 kg/m. GEN: Well nourished, well developed, in no acute distress  HEENT: normal  Neck: no JVD, carotid bruits, or masses Cardiac: RRR; no murmurs, rubs, or gallops,no edema  Respiratory:  clear to auscultation bilaterally, normal work of breathing GI: soft, nontender, nondistended, + BS MS: no deformity or atrophy  Skin: warm and dry Neuro:  Strength and sensation are intact Psych: euthymic mood, full affect  EKG:  EKG is ordered today. Personal review of the ekg ordered shows sinus rhythm, rate  Recent  Labs: 11/20/2020: BUN 15; Creatinine, Ser 1.04; Hemoglobin 15.6; Platelets 234; Potassium 3.6; Sodium 139    Lipid Panel  No results found for: CHOL, TRIG, HDL, CHOLHDL, VLDL, LDLCALC, LDLDIRECT   Wt Readings from Last 3 Encounters:  03/05/21 147 lb 3.2 oz (66.8 kg)  01/15/21 146 lb 9.6 oz (66.5 kg)  11/29/20 147 lb (66.7 kg)      Other studies Reviewed: Additional studies/ records that were reviewed today include: TTE 09/07/20  Review of the above records today demonstrates:   1. Left ventricular ejection fraction, by estimation, is 55 to 60%. The  left ventricle has normal function. The left ventricle has no regional  wall motion abnormalities. Left ventricular diastolic parameters were  normal.   2. Right ventricular systolic function is normal. The right ventricular  size is normal. There is normal pulmonary artery systolic pressure.   3. The mitral valve is normal in structure. Mild mitral valve  regurgitation. No evidence of mitral stenosis.   4. The aortic valve is normal in structure. Aortic valve regurgitation is  not visualized. No aortic stenosis is present.   5. The inferior vena cava is normal in size with greater than 50%  respiratory variability, suggesting right atrial pressure of 3 mmHg.   Cardiac monitor 09/12/2020 personally reviewed This study is remarkable for symptomatic atrial fibrillation (9% burden) and occasional premature atrial complex.   ASSESSMENT AND PLAN:  1.  Paroxysmal atrial fibrillation: CHA2DS2-VASc 2.  Currently on Eliquis 5 mg twice daily metoprolol.  Is now status post ablation 11/29/2020.  He is currently doing well.  He has no chest pain or shortness of breath.  He has not had any further episodes of atrial fibrillation.  No changes.  2.  Hypertension: Currently well controlled.  Has been taken off of his blood pressure medications this has improved since his ablation.   Current medicines are reviewed at length with the patient today.    The patient does not have concerns regarding his medicines.  The following changes were made today: None  Labs/ tests ordered today include:  No orders of the defined types were placed in this encounter.    Disposition:   FU with Christopher Duarte 3 months  Signed, Joseph Johns Meredith Leeds, MD  03/05/2021 12:25 PM     [place] [address] [phone] (office) [phone] (fax)

## 2021-03-07 NOTE — Addendum Note (Signed)
Addended by: Glean Salen on: 03/07/2021 04:54 PM   Modules accepted: Orders

## 2021-03-22 DIAGNOSIS — I10 Essential (primary) hypertension: Secondary | ICD-10-CM | POA: Diagnosis not present

## 2021-03-22 DIAGNOSIS — Z79899 Other long term (current) drug therapy: Secondary | ICD-10-CM | POA: Diagnosis not present

## 2021-03-22 DIAGNOSIS — E785 Hyperlipidemia, unspecified: Secondary | ICD-10-CM | POA: Diagnosis not present

## 2021-07-02 ENCOUNTER — Ambulatory Visit: Payer: Medicare Other | Admitting: Cardiology

## 2021-07-02 ENCOUNTER — Other Ambulatory Visit: Payer: Self-pay

## 2021-07-02 ENCOUNTER — Encounter: Payer: Self-pay | Admitting: Cardiology

## 2021-07-02 VITALS — BP 164/70 | HR 80 | Ht 68.5 in | Wt 146.0 lb

## 2021-07-02 DIAGNOSIS — I10 Essential (primary) hypertension: Secondary | ICD-10-CM | POA: Diagnosis not present

## 2021-07-02 DIAGNOSIS — I480 Paroxysmal atrial fibrillation: Secondary | ICD-10-CM

## 2021-07-02 MED ORDER — DILTIAZEM HCL ER COATED BEADS 360 MG PO CP24: 360.0000 mg | ORAL_CAPSULE | Freq: Every day | ORAL | 3 refills | Status: DC

## 2021-07-02 NOTE — Progress Notes (Signed)
Electrophysiology Office Note   Date:  07/02/2021   ID:  Christopher Duarte, DOB 1955-08-31, MRN [id number]  PCP:  Helen Hashimoto., MD  Cardiologist:  [place] Primary Electrophysiologist:  Thamar Holik Meredith Leeds, MD    Chief Complaint: AF   History of Present Illness: Christopher Duarte is a 66 y.o. male who is being seen today for the evaluation of AF at the request of Helen Hashimoto., MD. Presenting today for electrophysiology evaluation.  He has a history significant for hypertension, hyperlipidemia, BPH.  He is having intermittent palpitations as well as lightheadedness and dizziness.  He wore a cardiac monitor that showed atrial fibrillation with a burden of 9%.  He is now status post ablation 11/29/2020.  Today, denies symptoms of palpitations, chest pain, shortness of breath, orthopnea, PND, lower extremity edema, claudication, dizziness, presyncope, syncope, bleeding, or neurologic sequela. The patient is tolerating medications without difficulties.  He is noted no further episodes of atrial fibrillation.  He is overall happy with his control.  He does state that he feels his heart beating forcefully at times.  This is not during arrhythmia.   Past Medical History:  Diagnosis Date   Anemia 01/02/2013   Anxiety 07/02/2013   Arrhythmia    Benign prostatic hyperplasia 01/04/2013   Depression 01/02/2013   Hyperlipidemia    Hypertension    Kidney stones 01/02/2013   Major depression    Palpitation 01/02/2013   Prostatitis 01/02/2013   Pyelonephritis 01/02/2013   Past Surgical History:  Procedure Laterality Date   ATRIAL FIBRILLATION ABLATION N/A 11/29/2020   Procedure: ATRIAL FIBRILLATION ABLATION;  Surgeon: Constance Haw, MD;  Location: [facility];  Service: Cardiovascular;  Laterality: N/A;     Current Outpatient Medications  Medication Sig Dispense Refill   atorvastatin (LIPITOR) 10 MG tablet Take 10 mg by mouth daily.     cyclobenzaprine (FLEXERIL) 10 MG tablet  Take 10 mg by mouth at bedtime as needed for pain or muscle spasms.     Glycerin-Hypromellose-PEG 400 (DRY EYE RELIEF DROPS OP) Place 1 drop into both eyes daily as needed (dry eyes).     ibuprofen (ADVIL) 200 MG tablet Take 400 mg by mouth every 6 (six) hours as needed for headache or moderate pain.     No current facility-administered medications for this visit.    Allergies:   Patient has no known allergies.   Social History:  The patient  reports that he has never smoked. He has never used smokeless tobacco. He reports that he does not currently use alcohol. He reports that he does not use drugs.   Family History:  The patient's family history includes Prostate cancer in his father; Skin cancer in his mother.   ROS:  Please see the history of present illness.   Otherwise, review of systems is positive for none.   All other systems are reviewed and negative.   PHYSICAL EXAM: VS:  BP (!) 164/70    Pulse 80    Ht 5' 8.5" (1.74 m)    Wt 146 lb (66.2 kg)    SpO2 97%    BMI 21.88 kg/m  , BMI Body mass index is 21.88 kg/m. GEN: Well nourished, well developed, in no acute distress  HEENT: normal  Neck: no JVD, carotid bruits, or masses Cardiac: RRR; no murmurs, rubs, or gallops,no edema  Respiratory:  clear to auscultation bilaterally, normal work of breathing GI: soft, nontender, nondistended, + BS MS: no deformity or atrophy  Skin: warm and dry Neuro:  Strength and sensation are intact Psych: euthymic mood, full affect  EKG:  EKG is ordered today. Personal review of the ekg ordered shows this rhythm, rate 80  Recent Labs: 11/20/2020: BUN 15; Creatinine, Ser 1.04; Hemoglobin 15.6; Platelets 234; Potassium 3.6; Sodium 139    Lipid Panel  No results found for: CHOL, TRIG, HDL, CHOLHDL, VLDL, LDLCALC, LDLDIRECT   Wt Readings from Last 3 Encounters:  07/02/21 146 lb (66.2 kg)  03/05/21 147 lb 3.2 oz (66.8 kg)  01/15/21 146 lb 9.6 oz (66.5 kg)      Other studies  Reviewed: Additional studies/ records that were reviewed today include: TTE 09/07/20  Review of the above records today demonstrates:   1. Left ventricular ejection fraction, by estimation, is 55 to 60%. The  left ventricle has normal function. The left ventricle has no regional  wall motion abnormalities. Left ventricular diastolic parameters were  normal.   2. Right ventricular systolic function is normal. The right ventricular  size is normal. There is normal pulmonary artery systolic pressure.   3. The mitral valve is normal in structure. Mild mitral valve  regurgitation. No evidence of mitral stenosis.   4. The aortic valve is normal in structure. Aortic valve regurgitation is  not visualized. No aortic stenosis is present.   5. The inferior vena cava is normal in size with greater than 50%  respiratory variability, suggesting right atrial pressure of 3 mmHg.   Cardiac monitor 09/12/2020 personally reviewed This study is remarkable for symptomatic atrial fibrillation (9% burden) and occasional premature atrial complex.   ASSESSMENT AND PLAN:  1.  Paroxysmal atrial fibrillation: Currently on Eliquis 5 mg twice daily, diltiazem 120 mg daily.  CHA2DS2-VASc of 2.  Status post ablation 11/29/2020.  He is currently feeling well.  He would like to be off of some of his medications including Eliquis as it is quite expensive.  He Kona Yusuf restart his Eliquis if he goes back into atrial fibrillation.   2.  Hypertension: Blood pressure is elevated today.  It was normal at his last visit.  He Yeraldi Fidler check his blood pressures at home and let us know if it remains elevated.  Current medicines are reviewed at length with the patient today.   The patient does not have concerns regarding his medicines.  The following changes were made today: stop eliquis  Labs/ tests ordered today include:  Orders Placed This Encounter  Procedures   EKG 12-Lead      Disposition:   FU with Tracina Beaumont 6  months  Signed, Chiamaka Latka Meredith Leeds, MD  07/02/2021 12:20 PM     [place] [address] [phone] (office) [phone] (fax)

## 2021-07-02 NOTE — Patient Instructions (Addendum)
Medication Instructions:  Your physician has recommended you make the following change in your medication:  STOP Eliquis  *If you need a refill on your cardiac medications before your next appointment, please call your pharmacy*   Lab Work: None ordered   Testing/Procedures: None ordered   Follow-Up: At [facility], you and your health needs are our priority.  As part of our continuing mission to provide you with exceptional heart care, we have created designated Provider Care Teams.  These Care Teams include your primary Cardiologist (physician) and Advanced Practice Providers (APPs -  Physician Assistants and Nurse Practitioners) who all work together to provide you with the care you need, when you need it.  We recommend signing up for the patient portal called "MyChart".  Sign up information is provided on this After Visit Summary.  MyChart is used to connect with patients for Virtual Visits (Telemedicine).  Patients are able to view lab/test results, encounter notes, upcoming appointments, etc.  Non-urgent messages can be sent to your provider as well.   To learn more about what you can do with MyChart, go to [email or web address].    Your next appointment:   6 months  The format for your next appointment:   In Person  Provider:   Allegra Lai, MD    Thank you for choosing [facility]!!   Trinidad Curet, RN [phone]

## 2021-09-10 ENCOUNTER — Telehealth: Payer: Self-pay | Admitting: Cardiology

## 2021-09-10 NOTE — Telephone Encounter (Signed)
Pt has not stopped taking Diltiazem. ?Aware I will forward to Dr. Curt Bears for approval to refill this medication. ?Aware I will call him once reviewed/advised  ?

## 2021-09-10 NOTE — Telephone Encounter (Signed)
Pt calling requesting a refill on diltiazem. This medication is no longer on pt's medication list. Does pt supposed to still be taking this medication? Please address ?

## 2021-09-10 NOTE — Telephone Encounter (Signed)
?*  STAT* If patient is at the pharmacy, call can be transferred to refill team. ? ? ?1. Which medications need to be refilled? (please list name of each medication and dose if known) Diltiazem ? ?2. Which pharmacy/location (including street and city if local pharmacy) is medication to be sent to?[place], [place] - [address] ? ?3. Do they need a 30 day or 90 day supply? 90 day ?

## 2021-09-14 NOTE — Telephone Encounter (Signed)
Pt reports that he has not been taking the Diltiazem since we spoke and his BP have been doing ok/normal. ?Pt would like to monitor for now and will let us know if BP begin to raise above 120-130/70-80s. ?He appreciates our help with this. ?

## 2021-09-19 DIAGNOSIS — Z79899 Other long term (current) drug therapy: Secondary | ICD-10-CM | POA: Diagnosis not present

## 2021-09-19 DIAGNOSIS — I10 Essential (primary) hypertension: Secondary | ICD-10-CM | POA: Diagnosis not present

## 2021-09-19 DIAGNOSIS — E785 Hyperlipidemia, unspecified: Secondary | ICD-10-CM | POA: Diagnosis not present

## 2022-02-06 DIAGNOSIS — H2513 Age-related nuclear cataract, bilateral: Secondary | ICD-10-CM | POA: Diagnosis not present

## 2022-02-06 DIAGNOSIS — H43393 Other vitreous opacities, bilateral: Secondary | ICD-10-CM | POA: Diagnosis not present

## 2022-02-13 DIAGNOSIS — G2581 Restless legs syndrome: Secondary | ICD-10-CM | POA: Diagnosis not present

## 2022-02-13 DIAGNOSIS — L570 Actinic keratosis: Secondary | ICD-10-CM | POA: Diagnosis not present

## 2022-02-13 DIAGNOSIS — K5909 Other constipation: Secondary | ICD-10-CM | POA: Diagnosis not present

## 2022-02-13 DIAGNOSIS — E785 Hyperlipidemia, unspecified: Secondary | ICD-10-CM | POA: Diagnosis not present

## 2022-02-13 DIAGNOSIS — I10 Essential (primary) hypertension: Secondary | ICD-10-CM | POA: Diagnosis not present

## 2022-03-13 DIAGNOSIS — K581 Irritable bowel syndrome with constipation: Secondary | ICD-10-CM | POA: Diagnosis not present

## 2022-03-20 DIAGNOSIS — D485 Neoplasm of uncertain behavior of skin: Secondary | ICD-10-CM | POA: Diagnosis not present

## 2022-03-20 DIAGNOSIS — L814 Other melanin hyperpigmentation: Secondary | ICD-10-CM | POA: Diagnosis not present

## 2022-03-20 DIAGNOSIS — L570 Actinic keratosis: Secondary | ICD-10-CM | POA: Diagnosis not present

## 2022-03-20 DIAGNOSIS — D225 Melanocytic nevi of trunk: Secondary | ICD-10-CM | POA: Diagnosis not present

## 2022-03-20 DIAGNOSIS — L821 Other seborrheic keratosis: Secondary | ICD-10-CM | POA: Diagnosis not present

## 2022-03-20 DIAGNOSIS — L578 Other skin changes due to chronic exposure to nonionizing radiation: Secondary | ICD-10-CM | POA: Diagnosis not present

## 2022-04-11 DIAGNOSIS — I10 Essential (primary) hypertension: Secondary | ICD-10-CM | POA: Diagnosis not present

## 2022-04-11 DIAGNOSIS — Z1329 Encounter for screening for other suspected endocrine disorder: Secondary | ICD-10-CM | POA: Diagnosis not present

## 2022-04-11 DIAGNOSIS — E785 Hyperlipidemia, unspecified: Secondary | ICD-10-CM | POA: Diagnosis not present

## 2022-04-11 DIAGNOSIS — G2581 Restless legs syndrome: Secondary | ICD-10-CM | POA: Diagnosis not present

## 2022-04-25 DIAGNOSIS — L821 Other seborrheic keratosis: Secondary | ICD-10-CM | POA: Diagnosis not present

## 2022-04-25 DIAGNOSIS — L578 Other skin changes due to chronic exposure to nonionizing radiation: Secondary | ICD-10-CM | POA: Diagnosis not present

## 2022-04-25 DIAGNOSIS — L570 Actinic keratosis: Secondary | ICD-10-CM | POA: Diagnosis not present

## 2022-06-17 ENCOUNTER — Ambulatory Visit: Payer: Medicare Other | Admitting: Cardiology

## 2022-07-01 DIAGNOSIS — Z6821 Body mass index (BMI) 21.0-21.9, adult: Secondary | ICD-10-CM | POA: Diagnosis not present

## 2022-07-01 DIAGNOSIS — R109 Unspecified abdominal pain: Secondary | ICD-10-CM | POA: Diagnosis not present

## 2022-07-08 DIAGNOSIS — N200 Calculus of kidney: Secondary | ICD-10-CM | POA: Diagnosis not present

## 2022-07-08 DIAGNOSIS — N281 Cyst of kidney, acquired: Secondary | ICD-10-CM | POA: Diagnosis not present

## 2022-07-08 DIAGNOSIS — R109 Unspecified abdominal pain: Secondary | ICD-10-CM | POA: Diagnosis not present

## 2022-07-26 ENCOUNTER — Ambulatory Visit: Payer: Medicare Other | Attending: Cardiology | Admitting: Cardiology

## 2022-07-26 ENCOUNTER — Encounter: Payer: Self-pay | Admitting: Cardiology

## 2022-07-26 VITALS — BP 140/88 | HR 59 | Ht 68.5 in | Wt 138.0 lb

## 2022-07-26 DIAGNOSIS — I480 Paroxysmal atrial fibrillation: Secondary | ICD-10-CM

## 2022-07-26 DIAGNOSIS — I10 Essential (primary) hypertension: Secondary | ICD-10-CM

## 2022-07-26 NOTE — Progress Notes (Signed)
Electrophysiology Office Note   Date:  07/26/2022   ID:  Christopher Duarte, DOB [birth year], MRN [id number]  PCP:  Helen Hashimoto., MD  Cardiologist:  [place] Primary Electrophysiologist:  Lamoine Magallon Meredith Leeds, MD    Chief Complaint: AF   History of Present Illness: Christopher Duarte is a 67 y.o. male who is being seen today for the evaluation of AF at the request of Helen Hashimoto., MD. Presenting today for electrophysiology evaluation.  He has a history of hypertension, hyperlipidemia, BPH.  He required a cardiac monitor that showed a 9% atrial fibrillation burden.  He is post ablation 11/29/2020.  Today, denies symptoms of palpitations, chest pain, shortness of breath, orthopnea, PND, lower extremity edema, claudication, dizziness, presyncope, syncope, bleeding, or neurologic sequela. The patient is tolerating medications without difficulties.  Today he feels well.  He has noted no further episodes of atrial fibrillation.  He tells [place] that in October his son overdosed.  He is continuing to mourn.    Past Medical History:  Diagnosis Date   Anemia 01/02/2013   Anxiety 07/02/2013   Arrhythmia    Benign prostatic hyperplasia 01/04/2013   Depression 01/02/2013   Hyperlipidemia    Hypertension    Kidney stones 01/02/2013   Major depression    Palpitation 01/02/2013   Prostatitis 01/02/2013   Pyelonephritis 01/02/2013   Past Surgical History:  Procedure Laterality Date   ATRIAL FIBRILLATION ABLATION N/A 11/29/2020   Procedure: ATRIAL FIBRILLATION ABLATION;  Surgeon: Constance Haw, MD;  Location: [facility];  Service: Cardiovascular;  Laterality: N/A;     Current Outpatient Medications  Medication Sig Dispense Refill   atorvastatin (LIPITOR) 10 MG tablet Take 10 mg by mouth daily.     clonazePAM (KLONOPIN) 0.5 MG tablet Take 0.5 mg by mouth at bedtime.     cyclobenzaprine (FLEXERIL) 10 MG tablet Take 10 mg by mouth at bedtime as needed for pain or muscle spasms.      Glycerin-Hypromellose-PEG 400 (DRY EYE RELIEF DROPS OP) Place 1 drop into both eyes daily as needed (dry eyes).     ibuprofen (ADVIL) 200 MG tablet Take 400 mg by mouth every 6 (six) hours as needed for headache or moderate pain.     metoprolol tartrate (LOPRESSOR) 25 MG tablet Take 12.5 mg by mouth 2 (two) times daily.     No current facility-administered medications for this visit.    Allergies:   Patient has no known allergies.   Social History:  The patient  reports that he has never smoked. He has never used smokeless tobacco. He reports that he does not currently use alcohol. He reports that he does not use drugs.   Family History:  The patient's family history includes Prostate cancer in his father; Skin cancer in his mother.   ROS:  Please see the history of present illness.   Otherwise, review of systems is positive for none.   All other systems are reviewed and negative.   PHYSICAL EXAM: VS:  BP (!) 140/88   Pulse (!) 59   Ht 5' 8.5" (1.74 m)   Wt 138 lb (62.6 kg)   SpO2 96%   BMI 20.68 kg/m  , BMI Body mass index is 20.68 kg/m. GEN: Well nourished, well developed, in no acute distress  HEENT: normal  Neck: no JVD, carotid bruits, or masses Cardiac: RRR; no murmurs, rubs, or gallops,no edema  Respiratory:  clear to auscultation bilaterally, normal work of breathing GI: soft, nontender,  nondistended, + BS MS: no deformity or atrophy  Skin: warm and dry Neuro:  Strength and sensation are intact Psych: euthymic mood, full affect  EKG:  EKG is ordered today. Personal review of the ekg ordered shows sinus rhythm   Recent Labs: No results found for requested labs within last 365 days.    Lipid Panel  No results found for: "CHOL", "TRIG", "HDL", "CHOLHDL", "VLDL", "LDLCALC", "LDLDIRECT"   Wt Readings from Last 3 Encounters:  07/26/22 138 lb (62.6 kg)  07/02/21 146 lb (66.2 kg)  03/05/21 147 lb 3.2 oz (66.8 kg)      Other studies Reviewed: Additional studies/  records that were reviewed today include: TTE 09/07/20  Review of the above records today demonstrates:   1. Left ventricular ejection fraction, by estimation, is 55 to 60%. The  left ventricle has normal function. The left ventricle has no regional  wall motion abnormalities. Left ventricular diastolic parameters were  normal.   2. Right ventricular systolic function is normal. The right ventricular  size is normal. There is normal pulmonary artery systolic pressure.   3. The mitral valve is normal in structure. Mild mitral valve  regurgitation. No evidence of mitral stenosis.   4. The aortic valve is normal in structure. Aortic valve regurgitation is  not visualized. No aortic stenosis is present.   5. The inferior vena cava is normal in size with greater than 50%  respiratory variability, suggesting right atrial pressure of 3 mmHg.   Cardiac monitor 09/12/2020 personally reviewed This study is remarkable for symptomatic atrial fibrillation (9% burden) and occasional premature atrial complex.   ASSESSMENT AND PLAN:  1.  Paroxysmal atrial fibrillation: Currently on diltiazem.  CHA2DS2-VASc of 2.  Status post ablation 11/29/2020.  Eliquis was stopped at the last visit due to patient preference.  2.  Hypertension: well controlled   Current medicines are reviewed at length with the patient today.   The patient does not have concerns regarding his medicines.  The following changes were made today: none  Labs/ tests ordered today include:  Orders Placed This Encounter  Procedures   EKG 12-Lead      Disposition:   FU 12 months  Signed, Gatlin Kittell Meredith Leeds, MD  07/26/2022 2:51 PM     [place] [address] [place] [address] [phone] (office) [phone] (fax)

## 2022-10-16 DIAGNOSIS — G2581 Restless legs syndrome: Secondary | ICD-10-CM | POA: Diagnosis not present

## 2022-10-16 DIAGNOSIS — I10 Essential (primary) hypertension: Secondary | ICD-10-CM | POA: Diagnosis not present

## 2022-10-16 DIAGNOSIS — Z1329 Encounter for screening for other suspected endocrine disorder: Secondary | ICD-10-CM | POA: Diagnosis not present

## 2022-10-16 DIAGNOSIS — E785 Hyperlipidemia, unspecified: Secondary | ICD-10-CM | POA: Diagnosis not present

## 2023-01-22 DIAGNOSIS — I700 Atherosclerosis of aorta: Secondary | ICD-10-CM | POA: Diagnosis not present

## 2023-01-22 DIAGNOSIS — J690 Pneumonitis due to inhalation of food and vomit: Secondary | ICD-10-CM | POA: Diagnosis not present

## 2023-01-22 DIAGNOSIS — I2510 Atherosclerotic heart disease of native coronary artery without angina pectoris: Secondary | ICD-10-CM | POA: Diagnosis not present

## 2023-01-22 DIAGNOSIS — N289 Disorder of kidney and ureter, unspecified: Secondary | ICD-10-CM | POA: Diagnosis not present

## 2023-01-22 DIAGNOSIS — R0602 Shortness of breath: Secondary | ICD-10-CM | POA: Diagnosis not present

## 2023-01-22 DIAGNOSIS — R918 Other nonspecific abnormal finding of lung field: Secondary | ICD-10-CM | POA: Diagnosis not present

## 2023-01-22 DIAGNOSIS — R079 Chest pain, unspecified: Secondary | ICD-10-CM | POA: Diagnosis not present

## 2023-01-28 DIAGNOSIS — N289 Disorder of kidney and ureter, unspecified: Secondary | ICD-10-CM | POA: Diagnosis not present

## 2023-01-28 DIAGNOSIS — J690 Pneumonitis due to inhalation of food and vomit: Secondary | ICD-10-CM | POA: Diagnosis not present

## 2023-01-30 DIAGNOSIS — R0602 Shortness of breath: Secondary | ICD-10-CM | POA: Diagnosis not present

## 2023-01-30 DIAGNOSIS — R002 Palpitations: Secondary | ICD-10-CM | POA: Diagnosis not present

## 2023-01-30 DIAGNOSIS — R079 Chest pain, unspecified: Secondary | ICD-10-CM | POA: Diagnosis not present

## 2023-01-30 DIAGNOSIS — I2510 Atherosclerotic heart disease of native coronary artery without angina pectoris: Secondary | ICD-10-CM | POA: Diagnosis not present

## 2023-02-14 IMAGING — CT CT HEART MORPH/PULM VEIN W/ CM & W/O CA SCORE
2 of 6 series · 12 of 20 positions shown, 14 images · IV contrast (Omni 300)
Comparison: Chest CTA 05/30/2012.
COMPARISON: Chest CTA 11/23/2020.

Addendum:
EXAM:
OVER-READ INTERPRETATION  CT CHEST

The following report is an over-read performed by radiologist Dr.
Teal Webb [REDACTED] on 11/23/2020. This
over-read does not include interpretation of cardiac or coronary
anatomy or pathology. The coronary calcium score/coronary CTA
interpretation by the cardiologist is attached.
CLINICAL DATA: Atrial fibrillation scheduled for ablation.
Cardiac CTA
TECHNIQUE: A non-contrast, gated CT scan was obtained with axial slices of 3 mm
through the heart for calcium scoring. Calcium scoring was performed
using the Agatston method. A 120 kV retrospective, gated, contrast
cardiac scan was obtained. Gantry rotation speed was 250 msecs and
collimation was 0.6 mm. Nitroglycerin was not given. A delayed scan
was obtained to exclude left atrial appendage thrombus. The 3D
dataset was reconstructed in 5% intervals of the 0-95% of the R-R
cycle. Late systolic phases were analyzed on a dedicated workstation
using MPR, MIP, and VRT modes. The patient received 80 cc of
contrast.

[Series 8: 0-90% · axial · 0.39mm/px · z∈[+1139,+1236]mm · 6 of 2710 slices shown, 8 images]
[im 388/2710  vessel]
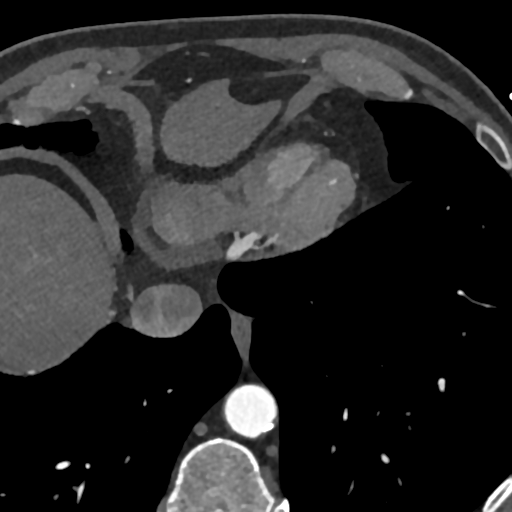
[im 388/2710  lung]
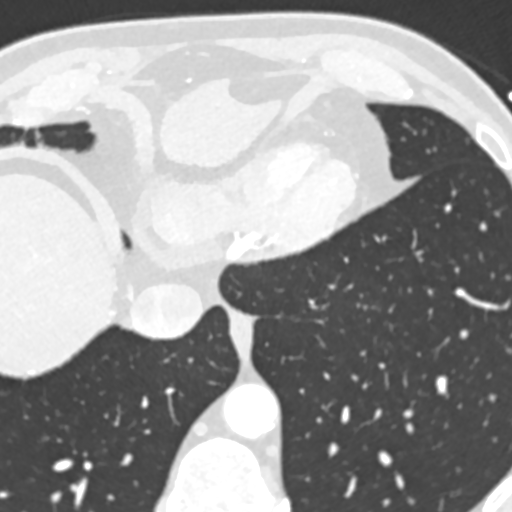
[im 775/2710  vessel]
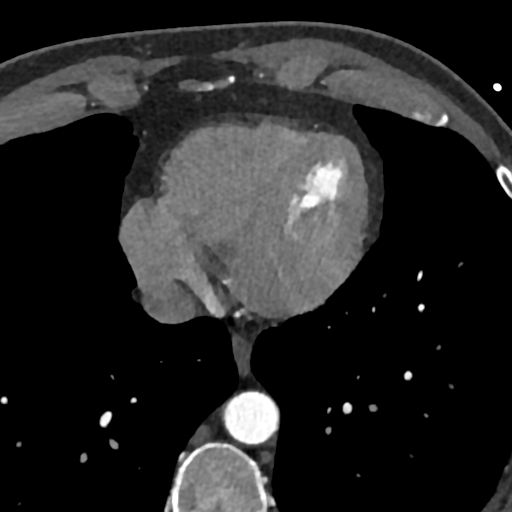
[im 1162/2710  vessel]
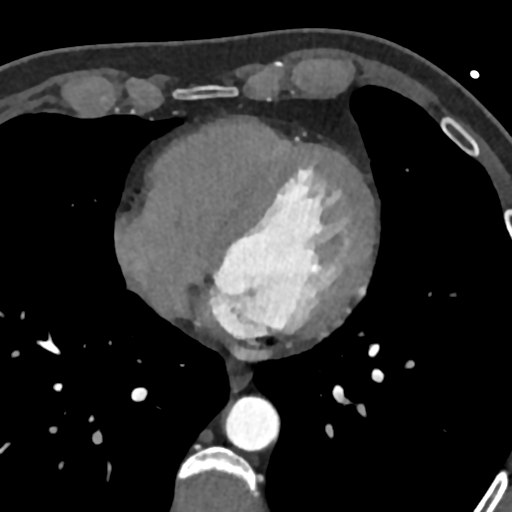
[im 1549/2710  vessel]
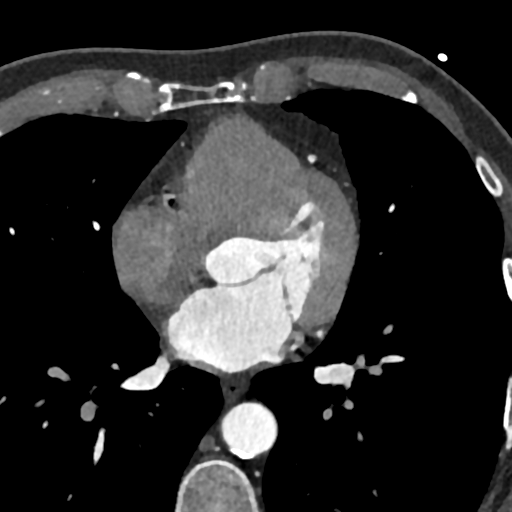
[im 1936/2710  vessel]
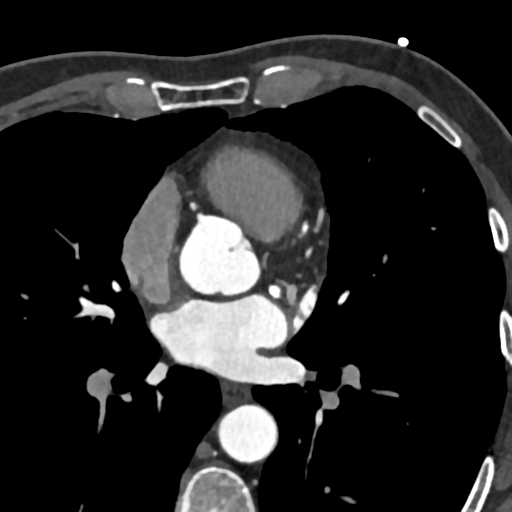
[im 1936/2710  lung]
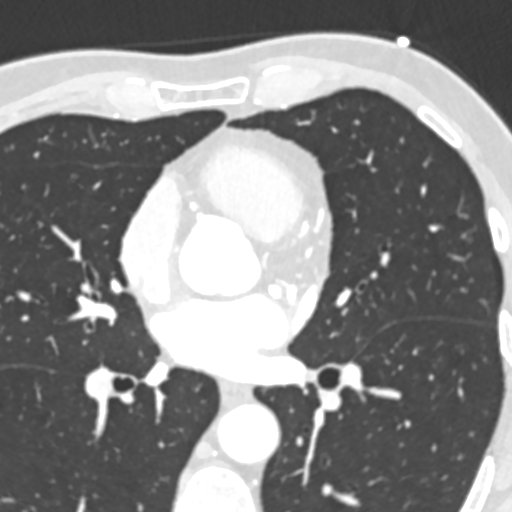
[im 2323/2710  vessel]
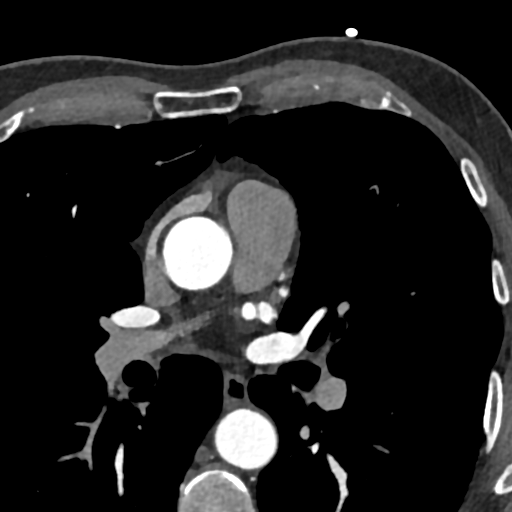

[Series 13: 5-95% · axial · 0.39mm/px · z∈[+1139,+1236]mm · 6 of 2710 slices shown]
[im 388/2710  vessel]
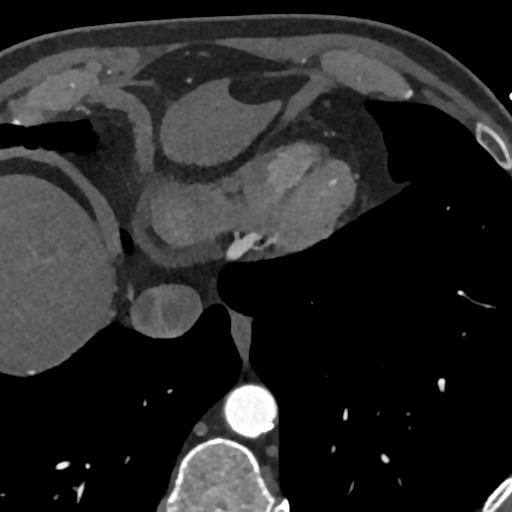
[im 775/2710  vessel]
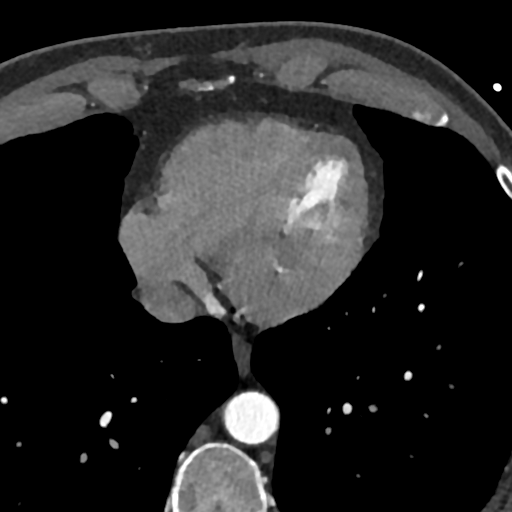
[im 1162/2710  vessel]
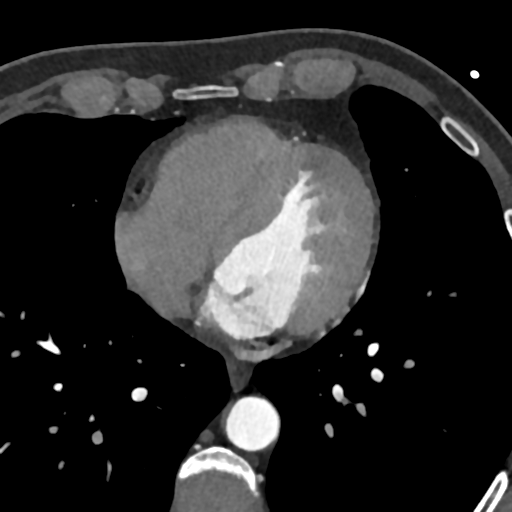
[im 1549/2710  vessel]
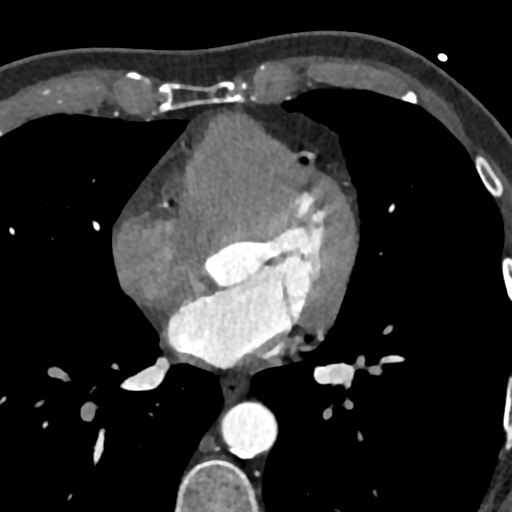
[im 1936/2710  vessel]
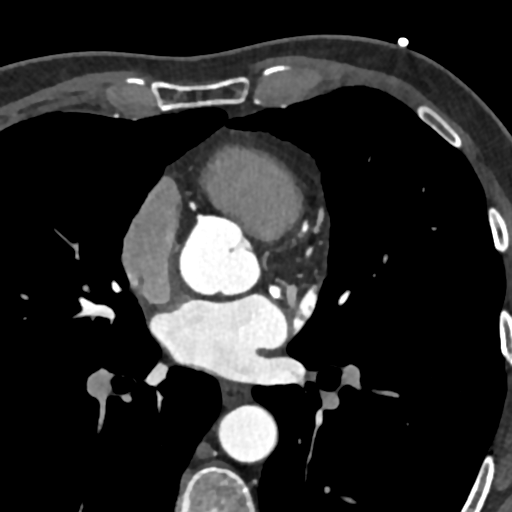
[im 2323/2710  vessel]
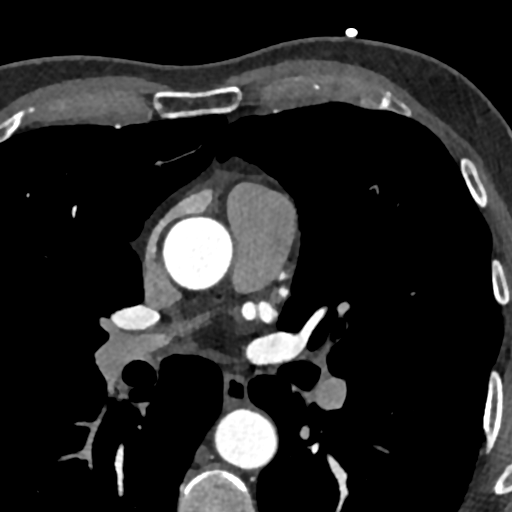

[12 of 20 positions shown; findings below may reference images not displayed]

FINDINGS: Atherosclerotic calcifications in the thoracic aorta. Subpleural
nodule associated with the minor fissure (axial image 16 of series
with a benign subpleural lymph node. Within the visualized portions
of the thorax there are no other suspicious appearing pulmonary
nodules or masses, there is no acute consolidative airspace disease,
no pleural effusions, no pneumothorax and no lymphadenopathy.
Visualized portions of the upper abdomen demonstrates diffuse low
attenuation throughout the visualized hepatic parenchyma, indicative
of hepatic steatosis. There are no aggressive appearing lytic or
blastic lesions noted in the visualized portions of the skeleton.
IMPRESSION: 1.  Aortic Atherosclerosis (DR0SQ-F2V.V).
2. Hepatic steatosis.
FINDINGS: Image quality: Average.

Noise artifact is: Moderate

Pulmonary Veins: There is normal pulmonary vein drainage into the
left atrium (3 on the right and 2 on the left) with ostial
measurements as follows:

RUPV: Ostium 19 x 13 mm  area 2.06 cm^2

RMPV: Ostium 7.6 x 6.3 mm area 0.36 cm^2

RLPV:  Ostium 14 x 12 mm  area 1.32 cm^2

LUPV:  Ostium 18 x 12 mm area 1.77 cm^2

LLPV:  Ostium 18 x 11 mm  area 1.59 cm^2

Left Atrium: The left atrial size is moderatly dilated. There is no
PFO/ASD. There is no thrombus in the left atrial appendage on
contrast or delayed imaging. The esophagus is in proximity to the
ostia of the left-sided pulmonary vein ostia.

Coronary Arteries: CAC score of 602 Agatston units, which is 83rd
percentile for age-, race-, and sex-matched controls. Normal
coronary origin. Left dominance. Small nondominant RCA with
calcified plaque but suspect no more than mild (<50%) stenosis.
Distal left main with mixed plaque, suspect about 50% stenosis.
Calcified plaque in the proximal LAD. Significant blooming artifact,
cannot rule [REDACTED]%) stenosis. Calcified plaque in the
proximal LCx, mild (<50%) stenosis. The study was performed without
use of NTG and is inadequate for plaque evaluation.

Right Atrium: Right atrial size is moderately dilated.

Right Ventricle: The right ventricular cavity is mildly dilated.

Left Ventricle: The ventricular cavity size is within normal limits.
There are no stigmata of prior infarction. There is no abnormal
filling defect.

Pericardium: Normal thickness with no significant effusion or
calcium present.

Pulmonary Artery: Normal caliber without proximal filling defect.

Aorta: Normal caliber with no significant disease.

Extra-cardiac findings: See attached radiology report for
non-cardiac structures.
IMPRESSION: 1. There is normal pulmonary vein drainage into the left atrium with
ostial measurements above.

2. There is no thrombus in the left atrial appendage.

3. The esophagus is posterior to the left-sided pulmonary vein
ostia.

4. No PFO/ASD.

5. Normal coronary origin. Left dominance dominance. Concern for 50%
distal left main stenosis and possible moderate proximal LAD
stenosis.

6. CAC score of 602 Agatston units which is 83rd percentile for
age-, race-, and sex-matched controls.

*** End of Addendum ***
EXAM:
OVER-READ INTERPRETATION  CT CHEST

The following report is an over-read performed by radiologist Dr.
Teal Webb [REDACTED] on [DATE]. This
over-read does not include interpretation of cardiac or coronary
anatomy or pathology. The coronary calcium score/coronary CTA
interpretation by the cardiologist is attached.
FINDINGS: Atherosclerotic calcifications in the thoracic aorta. Subpleural
nodule associated with the minor fissure (axial image 16 of series
with a benign subpleural lymph node. Within the visualized portions
of the thorax there are no other suspicious appearing pulmonary
nodules or masses, there is no acute consolidative airspace disease,
no pleural effusions, no pneumothorax and no lymphadenopathy.
Visualized portions of the upper abdomen demonstrates diffuse low
attenuation throughout the visualized hepatic parenchyma, indicative
of hepatic steatosis. There are no aggressive appearing lytic or
blastic lesions noted in the visualized portions of the skeleton.
IMPRESSION: 1.  Aortic Atherosclerosis (DR0SQ-F2V.V).
2. Hepatic steatosis.

## 2023-03-12 DIAGNOSIS — I700 Atherosclerosis of aorta: Secondary | ICD-10-CM | POA: Diagnosis not present

## 2023-03-12 DIAGNOSIS — N281 Cyst of kidney, acquired: Secondary | ICD-10-CM | POA: Diagnosis not present

## 2023-03-13 DIAGNOSIS — Z0000 Encounter for general adult medical examination without abnormal findings: Secondary | ICD-10-CM | POA: Diagnosis not present

## 2023-03-13 DIAGNOSIS — Z9181 History of falling: Secondary | ICD-10-CM | POA: Diagnosis not present

## 2023-03-14 DIAGNOSIS — I495 Sick sinus syndrome: Secondary | ICD-10-CM | POA: Diagnosis not present

## 2023-03-14 DIAGNOSIS — R079 Chest pain, unspecified: Secondary | ICD-10-CM | POA: Diagnosis not present

## 2023-03-26 ENCOUNTER — Encounter: Payer: Self-pay | Admitting: *Deleted

## 2023-03-26 ENCOUNTER — Telehealth: Payer: Self-pay | Admitting: Cardiology

## 2023-03-26 DIAGNOSIS — D225 Melanocytic nevi of trunk: Secondary | ICD-10-CM | POA: Diagnosis not present

## 2023-03-26 DIAGNOSIS — L578 Other skin changes due to chronic exposure to nonionizing radiation: Secondary | ICD-10-CM | POA: Diagnosis not present

## 2023-03-26 DIAGNOSIS — L821 Other seborrheic keratosis: Secondary | ICD-10-CM | POA: Diagnosis not present

## 2023-03-26 DIAGNOSIS — L814 Other melanin hyperpigmentation: Secondary | ICD-10-CM | POA: Diagnosis not present

## 2023-03-26 NOTE — Telephone Encounter (Signed)
patient called and had some questions regarding the heart monitor he wore a few weeks ago

## 2023-03-26 NOTE — Telephone Encounter (Signed)
Called pt. He states he had a heart monitor ordered at [facility]. He reached out to get the results. They stated "we do not have anyone assigned to your monitor. Maybe reach out to your cardiologist to get them to give you the results." "So I'm calling  you all." Will get message to Dr. Servando Salina for review.

## 2023-04-02 NOTE — Telephone Encounter (Signed)
Called pt, appt made to discuss monitor results with Dr. Servando Salina.

## 2023-04-17 ENCOUNTER — Encounter: Payer: Self-pay | Admitting: Cardiology

## 2023-04-17 ENCOUNTER — Ambulatory Visit: Payer: Medicare Other | Attending: Cardiology | Admitting: Cardiology

## 2023-04-17 VITALS — BP 122/70 | HR 74 | Ht 68.0 in | Wt 135.8 lb

## 2023-04-17 DIAGNOSIS — Z01812 Encounter for preprocedural laboratory examination: Secondary | ICD-10-CM

## 2023-04-17 DIAGNOSIS — E782 Mixed hyperlipidemia: Secondary | ICD-10-CM

## 2023-04-17 DIAGNOSIS — I480 Paroxysmal atrial fibrillation: Secondary | ICD-10-CM

## 2023-04-17 DIAGNOSIS — I2510 Atherosclerotic heart disease of native coronary artery without angina pectoris: Secondary | ICD-10-CM

## 2023-04-17 DIAGNOSIS — I10 Essential (primary) hypertension: Secondary | ICD-10-CM

## 2023-04-17 NOTE — Patient Instructions (Addendum)
Medication Instructions:  Start  Aspirin 81 mg daily Continue all current medications *If you need a refill on your cardiac medications before your next appointment, please call your pharmacy*   Lab Work: BMET, CBC, Mg today  If you have labs (blood work) drawn today and your tests are completely normal, you will receive your results only by: MyChart Message (if you have MyChart) OR A paper copy in the mail If you have any lab test that is abnormal or we need to change your treatment, we will call you to review the results.   Testing/Procedures: You are scheduled for a Cardiac Catheterization on Wednesday, November 13 with Dr. Tonny Bollman.  1. Please arrive at the [facility] (Main Entrance A) at [facility]: [address] [place], [place] [address] at 9:30 AM (This time is 2 hour(s) before your procedure to ensure your preparation). Free valet parking service is available. You will check in at ADMITTING. The support person will be asked to wait in the waiting room.  It is OK to have someone drop you off and come back when you are ready to be discharged.    Special note: Every effort is made to have your procedure done on time. Please understand that emergencies sometimes delay scheduled procedures.  2. Diet: Do not eat solid foods after midnight.  The patient may have clear liquids until 5am upon the day of the procedure.  3. Labs: You will need to have blood drawn on  today 4. Medication instructions in preparation for your procedure:   Contrast Allergy: No   On the morning of your procedure, take your Aspirin 81 mg and any morning medicines NOT listed above.  You may use sips of water.  5. Plan to go home the same day, you will only stay overnight if medically necessary. 6. Bring a current list of your medications and current insurance cards. 7. You MUST have a responsible person to drive you home. 8. Someone MUST be with you the first 24 hours after you arrive home  or your discharge will be delayed. 9. Please wear clothes that are easy to get on and off and wear slip-on shoes.  Thank you for allowing [place] to care for you!   -- [facility]    Follow-Up: At [facility], you and your health needs are our priority.  As part of our continuing mission to provide you with exceptional heart care, we have created designated Provider Care Teams.  These Care Teams include your primary Cardiologist (physician) and Advanced Practice Providers (APPs -  Physician Assistants and Nurse Practitioners) who all work together to provide you with the care you need, when you need it.  We recommend signing up for the patient portal called "MyChart".  Sign up information is provided on this After Visit Summary.  MyChart is used to connect with patients for Virtual Visits (Telemedicine).  Patients are able to view lab/test results, encounter notes, upcoming appointments, etc.  Non-urgent messages can be sent to your provider as well.   To learn more about what you can do with MyChart, go to [email or web address].    Your next appointment:   4 week(s)  Provider:   Thomasene Ripple, DO

## 2023-04-17 NOTE — H&P (View-Only) (Signed)
 Cardiology Office Note:    Date:  04/17/2023   ID:  Christopher Duarte, DOB Oct 21, 1955, MRN [id number]  PCP:  Wilmer Floor., MD  Cardiologist:  Thomasene Ripple, DO  Electrophysiologist:  Regan Lemming, MD   Referring MD: Wilmer Floor., MD   "I am having chest pain"  History of Present Illness:    Christopher Duarte is a 67 y.o. male with a hx of atrial fibrillation status post ablation he stopped the Eliquis due to his preference despite CHA2DS2-VASc score being 2 coronary artery disease, hypertension, hyperlipidemia.  Patient had no follow-up since 2022.  Recently getting information for a ZIO monitor that he had placed on at the ED at [facility].  Today he tells me that he transitioned briefly to [facility] cardiologist when he was in the ED.  But now comes here to follow-up.  It is that he has some left-sided chest discomfort and some shortness of breath on minimal exertion.  He is concerned because the chest discomfort is persistent on exertion.  He described this as a left-sided pressure like sensation.  Denies any radiation of the pain.  He does complain of some palpitations and heart pounding.  Other complaints at this time.  Past Medical History:  Diagnosis Date   Anemia 01/02/2013   Anxiety 07/02/2013   Arrhythmia    Benign prostatic hyperplasia 01/04/2013   Depression 01/02/2013   Hyperlipidemia    Hypertension    Kidney stones 01/02/2013   Major depression    Palpitation 01/02/2013   Prostatitis 01/02/2013   Pyelonephritis 01/02/2013    Past Surgical History:  Procedure Laterality Date   ATRIAL FIBRILLATION ABLATION N/A 11/29/2020   Procedure: ATRIAL FIBRILLATION ABLATION;  Surgeon: Regan Lemming, MD;  Location: [facility];  Service: Cardiovascular;  Laterality: N/A;    Current Medications: Current Meds  Medication Sig   atorvastatin (LIPITOR) 10 MG tablet Take 10 mg by mouth daily.   clonazePAM (KLONOPIN) 0.5 MG tablet Take 0.5 mg by mouth  at bedtime.   ibuprofen (ADVIL) 200 MG tablet Take 400 mg by mouth every 6 (six) hours as needed for headache or moderate pain.   metoprolol tartrate (LOPRESSOR) 25 MG tablet Take 12.5 mg by mouth 2 (two) times daily.     Allergies:   Patient has no known allergies.   Social History   Socioeconomic History   Marital status: Married    Spouse name: Not on file   Number of children: Not on file   Years of education: Not on file   Highest education level: Not on file  Occupational History   Not on file  Tobacco Use   Smoking status: Never   Smokeless tobacco: Never  Substance and Sexual Activity   Alcohol use: Not Currently    Comment: Occasional   Drug use: Never   Sexual activity: Not on file  Other Topics Concern   Not on file  Social History Narrative   Not on file   Social Determinants of Health   Financial Resource Strain: Not on file  Food Insecurity: Not on file  Transportation Needs: Not on file  Physical Activity: Not on file  Stress: Not on file  Social Connections: Not on file     Family History: The patient's family history includes Prostate cancer in his father; Skin cancer in his mother.  ROS:   Review of Systems  Constitution: Negative for decreased appetite, fever and weight gain.  HENT: Negative for  congestion, ear discharge, hoarse voice and sore throat.   Eyes: Negative for discharge, redness, vision loss in right eye and visual halos.  Cardiovascular: Negative for chest pain, dyspnea on exertion, leg swelling, orthopnea and palpitations.  Respiratory: Negative for cough, hemoptysis, shortness of breath and snoring.   Endocrine: Negative for heat intolerance and polyphagia.  Hematologic/Lymphatic: Negative for bleeding problem. Does not bruise/bleed easily.  Skin: Negative for flushing, nail changes, rash and suspicious lesions.  Musculoskeletal: Negative for arthritis, joint pain, muscle cramps, myalgias, neck pain and stiffness.   Gastrointestinal: Negative for abdominal pain, bowel incontinence, diarrhea and excessive appetite.  Genitourinary: Negative for decreased libido, genital sores and incomplete emptying.  Neurological: Negative for brief paralysis, focal weakness, headaches and loss of balance.  Psychiatric/Behavioral: Negative for altered mental status, depression and suicidal ideas.  Allergic/Immunologic: Negative for HIV exposure and persistent infections.    EKGs/Labs/Other Studies Reviewed:    The following studies were reviewed today:   EKG:  The ekg ordered today demonstrates   Recent Labs: No results found for requested labs within last 365 days.  Recent Lipid Panel No results found for: "CHOL", "TRIG", "HDL", "CHOLHDL", "VLDL", "LDLCALC", "LDLDIRECT"  Physical Exam:    VS:  BP 122/70 (BP Location: Right Arm, Patient Position: Sitting, Cuff Size: Normal)   Pulse 74   Ht 5\' 8"  (1.727 m)   Wt 135 lb 12.8 oz (61.6 kg)   SpO2 94%   BMI 20.65 kg/m     Wt Readings from Last 3 Encounters:  04/17/23 135 lb 12.8 oz (61.6 kg)  07/26/22 138 lb (62.6 kg)  07/02/21 146 lb (66.2 kg)     GEN: Well nourished, well developed in no acute distress HEENT: Normal NECK: No JVD; No carotid bruits LYMPHATICS: No lymphadenopathy CARDIAC: S1S2 noted,RRR, no murmurs, rubs, gallops RESPIRATORY:  Clear to auscultation without rales, wheezing or rhonchi  ABDOMEN: Soft, non-tender, non-distended, +bowel sounds, no guarding. EXTREMITIES: No edema, No cyanosis, no clubbing MUSCULOSKELETAL:  No deformity  SKIN: Warm and dry NEUROLOGIC:  Alert and oriented x 3, non-focal PSYCHIATRIC:  Normal affect, good insight  ASSESSMENT:    1. Paroxysmal atrial fibrillation (HCC)   2. Essential hypertension   3. Pre-procedure lab exam   4. Coronary artery disease involving native coronary artery of native heart, unspecified whether angina present   5. Mixed hyperlipidemia    PLAN:    Coronary Artery Disease with  suspected angina History of 50% blockage in left main coronary artery on CT scan from 2022. Patient reports occasional chest pain. Concern for progression of disease given the time elapsed since last imaging and the presence of symptoms. -Order heart catheterization to assess current state of coronary arteries. -The patient understands that risks include but are not limited to stroke (1 in 1000), death (1 in 1000), kidney failure [usually temporary] (1 in 500), bleeding (1 in 200), allergic reaction [possibly serious] (1 in 200), and agrees to proceed. -Start Aspirin 81mg  daily for antiplatelet effect.  continue Lipitor  Atrial fibrillation Patient reports feeling heart beating hard, particularly when at rest. No evidence of arrhythmia on recent monitoring. Possible subclinical anxiety contributing to symptom perception. -Monitor symptom progression. Continue low-dose Lopressor 12.5 mg twice daily. CHADSvasc score is 2 patient preference declines anticoagulation.  Blood pressure is acceptable, continue with current antihypertensive regimen.  Hyperlipidemia - continue with current statin medication.  Follow-up in 4 weeks post-catheterization to discuss results and formulate ongoing care plan.  The patient is in agreement with the  above plan. The patient left the office in stable condition.  The patient will follow up in   Medication Adjustments/Labs and Tests Ordered: Current medicines are reviewed at length with the patient today.  Concerns regarding medicines are outlined above.  Orders Placed This Encounter  Procedures   CBC w/Diff/Platelet   Magnesium   Basic Metabolic Panel (BMET)   EKG 12-Lead   No orders of the defined types were placed in this encounter.   Patient Instructions  Medication Instructions:  Start  Aspirin 81 mg daily Continue all current medications *If you need a refill on your cardiac medications before your next appointment, please call your  pharmacy*   Lab Work: BMET, CBC, Mg today  If you have labs (blood work) drawn today and your tests are completely normal, you will receive your results only by: MyChart Message (if you have MyChart) OR A paper copy in the mail If you have any lab test that is abnormal or we need to change your treatment, we will call you to review the results.   Testing/Procedures: You are scheduled for a Cardiac Catheterization on Wednesday, November 13 with Dr. Tonny Bollman.  1. Please arrive at the [facility] (Main Entrance A) at [facility]: [address] [place], [place] [address] at 9:30 AM (This time is 2 hour(s) before your procedure to ensure your preparation). Free valet parking service is available. You will check in at ADMITTING. The support person will be asked to wait in the waiting room.  It is Duarte to have someone drop you off and come back when you are ready to be discharged.    Special note: Every effort is made to have your procedure done on time. Please understand that emergencies sometimes delay scheduled procedures.  2. Diet: Do not eat solid foods after midnight.  The patient may have clear liquids until 5am upon the day of the procedure.  3. Labs: You will need to have blood drawn on  today 4. Medication instructions in preparation for your procedure:   Contrast Allergy: No   On the morning of your procedure, take your Aspirin 81 mg and any morning medicines NOT listed above.  You may use sips of water.  5. Plan to go home the same day, you will only stay overnight if medically necessary. 6. Bring a current list of your medications and current insurance cards. 7. You MUST have a responsible person to drive you home. 8. Someone MUST be with you the first 24 hours after you arrive home or your discharge will be delayed. 9. Please wear clothes that are easy to get on and off and wear slip-on shoes.  Thank you for allowing [place] to care for you!   -- [place]  [facility]    Follow-Up: At [facility], you and your health needs are our priority.  As part of our continuing mission to provide you with exceptional heart care, we have created designated Provider Care Teams.  These Care Teams include your primary Cardiologist (physician) and Advanced Practice Providers (APPs -  Physician Assistants and Nurse Practitioners) who all work together to provide you with the care you need, when you need it.  We recommend signing up for the patient portal called "MyChart".  Sign up information is provided on this After Visit Summary.  MyChart is used to connect with patients for Virtual Visits (Telemedicine).  Patients are able to view lab/test results, encounter notes, upcoming appointments, etc.  Non-urgent  messages can be sent to your provider as well.   To learn more about what you can do with MyChart, go to [email or web address].    Your next appointment:   4 week(s)  Provider:   Thomasene Ripple, DO       Adopting a Healthy Lifestyle.  Know what a healthy weight is for you (roughly BMI <25) and aim to maintain this   Aim for 7+ servings of fruits and vegetables daily   65-80+ fluid ounces of water or unsweet tea for healthy kidneys   Limit to max 1 drink of alcohol per day; avoid smoking/tobacco   Limit animal fats in diet for cholesterol and heart health - choose grass fed whenever available   Avoid highly processed foods, and foods high in saturated/trans fats   Aim for low stress - take time to unwind and care for your mental health   Aim for 150 min of moderate intensity exercise weekly for heart health, and weights twice weekly for bone health   Aim for 7-9 hours of sleep daily   When it comes to diets, agreement about the perfect plan isnt easy to find, even among the experts. Experts at the [facility] [employer] developed an idea known as the Healthy Eating Plate. Just imagine a plate divided into  logical, healthy portions.   The emphasis is on diet quality:   Load up on vegetables and fruits - one-half of your plate: Aim for color and variety, and remember that potatoes dont count.   Go for whole grains - one-quarter of your plate: Whole wheat, barley, wheat berries, quinoa, oats, brown rice, and foods made with them. If you want pasta, go with whole wheat pasta.   Protein power - one-quarter of your plate: Fish, chicken, beans, and nuts are all healthy, versatile protein sources. Limit red meat.   The diet, however, does go beyond the plate, offering a few other suggestions.   Use healthy plant oils, such as olive, canola, soy, corn, sunflower and peanut. Check the labels, and avoid partially hydrogenated oil, which have unhealthy trans fats.   If youre thirsty, drink water. Coffee and tea are good in moderation, but skip sugary drinks and limit milk and dairy products to one or two daily servings.   The type of carbohydrate in the diet is more important than the amount. Some sources of carbohydrates, such as vegetables, fruits, whole grains, and beans-are healthier than others.   Finally, stay active  Signed, Thomasene Ripple, DO  04/17/2023 10:15 AM    [facility]

## 2023-04-17 NOTE — Progress Notes (Signed)
Cardiology Office Note:    Date:  04/17/2023   ID:  Ralene Duarte, DOB Oct 21, 1955, MRN [id number]  PCP:  Wilmer Floor., MD  Cardiologist:  Thomasene Ripple, DO  Electrophysiologist:  Regan Lemming, MD   Referring MD: Wilmer Floor., MD   "I am having chest pain"  History of Present Illness:    Christopher Duarte is a 67 y.o. male with a hx of atrial fibrillation status post ablation he stopped the Eliquis due to his preference despite CHA2DS2-VASc score being 2 coronary artery disease, hypertension, hyperlipidemia.  Patient had no follow-up since 2022.  Recently getting information for a ZIO monitor that he had placed on at the ED at [facility].  Today he tells me that he transitioned briefly to [facility] cardiologist when he was in the ED.  But now comes here to follow-up.  It is that he has some left-sided chest discomfort and some shortness of breath on minimal exertion.  He is concerned because the chest discomfort is persistent on exertion.  He described this as a left-sided pressure like sensation.  Denies any radiation of the pain.  He does complain of some palpitations and heart pounding.  Other complaints at this time.  Past Medical History:  Diagnosis Date   Anemia 01/02/2013   Anxiety 07/02/2013   Arrhythmia    Benign prostatic hyperplasia 01/04/2013   Depression 01/02/2013   Hyperlipidemia    Hypertension    Kidney stones 01/02/2013   Major depression    Palpitation 01/02/2013   Prostatitis 01/02/2013   Pyelonephritis 01/02/2013    Past Surgical History:  Procedure Laterality Date   ATRIAL FIBRILLATION ABLATION N/A 11/29/2020   Procedure: ATRIAL FIBRILLATION ABLATION;  Surgeon: Regan Lemming, MD;  Location: [facility];  Service: Cardiovascular;  Laterality: N/A;    Current Medications: Current Meds  Medication Sig   atorvastatin (LIPITOR) 10 MG tablet Take 10 mg by mouth daily.   clonazePAM (KLONOPIN) 0.5 MG tablet Take 0.5 mg by mouth  at bedtime.   ibuprofen (ADVIL) 200 MG tablet Take 400 mg by mouth every 6 (six) hours as needed for headache or moderate pain.   metoprolol tartrate (LOPRESSOR) 25 MG tablet Take 12.5 mg by mouth 2 (two) times daily.     Allergies:   Patient has no known allergies.   Social History   Socioeconomic History   Marital status: Married    Spouse name: Not on file   Number of children: Not on file   Years of education: Not on file   Highest education level: Not on file  Occupational History   Not on file  Tobacco Use   Smoking status: Never   Smokeless tobacco: Never  Substance and Sexual Activity   Alcohol use: Not Currently    Comment: Occasional   Drug use: Never   Sexual activity: Not on file  Other Topics Concern   Not on file  Social History Narrative   Not on file   Social Determinants of Health   Financial Resource Strain: Not on file  Food Insecurity: Not on file  Transportation Needs: Not on file  Physical Activity: Not on file  Stress: Not on file  Social Connections: Not on file     Family History: The patient's family history includes Prostate cancer in his father; Skin cancer in his mother.  ROS:   Review of Systems  Constitution: Negative for decreased appetite, fever and weight gain.  HENT: Negative for  congestion, ear discharge, hoarse voice and sore throat.   Eyes: Negative for discharge, redness, vision loss in right eye and visual halos.  Cardiovascular: Negative for chest pain, dyspnea on exertion, leg swelling, orthopnea and palpitations.  Respiratory: Negative for cough, hemoptysis, shortness of breath and snoring.   Endocrine: Negative for heat intolerance and polyphagia.  Hematologic/Lymphatic: Negative for bleeding problem. Does not bruise/bleed easily.  Skin: Negative for flushing, nail changes, rash and suspicious lesions.  Musculoskeletal: Negative for arthritis, joint pain, muscle cramps, myalgias, neck pain and stiffness.   Gastrointestinal: Negative for abdominal pain, bowel incontinence, diarrhea and excessive appetite.  Genitourinary: Negative for decreased libido, genital sores and incomplete emptying.  Neurological: Negative for brief paralysis, focal weakness, headaches and loss of balance.  Psychiatric/Behavioral: Negative for altered mental status, depression and suicidal ideas.  Allergic/Immunologic: Negative for HIV exposure and persistent infections.    EKGs/Labs/Other Studies Reviewed:    The following studies were reviewed today:   EKG:  The ekg ordered today demonstrates   Recent Labs: No results found for requested labs within last 365 days.  Recent Lipid Panel No results found for: "CHOL", "TRIG", "HDL", "CHOLHDL", "VLDL", "LDLCALC", "LDLDIRECT"  Physical Exam:    VS:  BP 122/70 (BP Location: Right Arm, Patient Position: Sitting, Cuff Size: Normal)   Pulse 74   Ht 5\' 8"  (1.727 m)   Wt 135 lb 12.8 oz (61.6 kg)   SpO2 94%   BMI 20.65 kg/m     Wt Readings from Last 3 Encounters:  04/17/23 135 lb 12.8 oz (61.6 kg)  07/26/22 138 lb (62.6 kg)  07/02/21 146 lb (66.2 kg)     GEN: Well nourished, well developed in no acute distress HEENT: Normal NECK: No JVD; No carotid bruits LYMPHATICS: No lymphadenopathy CARDIAC: S1S2 noted,RRR, no murmurs, rubs, gallops RESPIRATORY:  Clear to auscultation without rales, wheezing or rhonchi  ABDOMEN: Soft, non-tender, non-distended, +bowel sounds, no guarding. EXTREMITIES: No edema, No cyanosis, no clubbing MUSCULOSKELETAL:  No deformity  SKIN: Warm and dry NEUROLOGIC:  Alert and oriented x 3, non-focal PSYCHIATRIC:  Normal affect, good insight  ASSESSMENT:    1. Paroxysmal atrial fibrillation (HCC)   2. Essential hypertension   3. Pre-procedure lab exam   4. Coronary artery disease involving native coronary artery of native heart, unspecified whether angina present   5. Mixed hyperlipidemia    PLAN:    Coronary Artery Disease with  suspected angina History of 50% blockage in left main coronary artery on CT scan from 2022. Patient reports occasional chest pain. Concern for progression of disease given the time elapsed since last imaging and the presence of symptoms. -Order heart catheterization to assess current state of coronary arteries. -The patient understands that risks include but are not limited to stroke (1 in 1000), death (1 in 1000), kidney failure [usually temporary] (1 in 500), bleeding (1 in 200), allergic reaction [possibly serious] (1 in 200), and agrees to proceed. -Start Aspirin 81mg  daily for antiplatelet effect.  continue Lipitor  Atrial fibrillation Patient reports feeling heart beating hard, particularly when at rest. No evidence of arrhythmia on recent monitoring. Possible subclinical anxiety contributing to symptom perception. -Monitor symptom progression. Continue low-dose Lopressor 12.5 mg twice daily. CHADSvasc score is 2 patient preference declines anticoagulation.  Blood pressure is acceptable, continue with current antihypertensive regimen.  Hyperlipidemia - continue with current statin medication.  Follow-up in 4 weeks post-catheterization to discuss results and formulate ongoing care plan.  The patient is in agreement with the  above plan. The patient left the office in stable condition.  The patient will follow up in   Medication Adjustments/Labs and Tests Ordered: Current medicines are reviewed at length with the patient today.  Concerns regarding medicines are outlined above.  Orders Placed This Encounter  Procedures   CBC w/Diff/Platelet   Magnesium   Basic Metabolic Panel (BMET)   EKG 12-Lead   No orders of the defined types were placed in this encounter.   Patient Instructions  Medication Instructions:  Start  Aspirin 81 mg daily Continue all current medications *If you need a refill on your cardiac medications before your next appointment, please call your  pharmacy*   Lab Work: BMET, CBC, Mg today  If you have labs (blood work) drawn today and your tests are completely normal, you will receive your results only by: MyChart Message (if you have MyChart) OR A paper copy in the mail If you have any lab test that is abnormal or we need to change your treatment, we will call you to review the results.   Testing/Procedures: You are scheduled for a Cardiac Catheterization on Wednesday, November 13 with Dr. Tonny Bollman.  1. Please arrive at the [facility] (Main Entrance A) at [facility]: [address] [place], [place] [address] at 9:30 AM (This time is 2 hour(s) before your procedure to ensure your preparation). Free valet parking service is available. You will check in at ADMITTING. The support person will be asked to wait in the waiting room.  It is Duarte to have someone drop you off and come back when you are ready to be discharged.    Special note: Every effort is made to have your procedure done on time. Please understand that emergencies sometimes delay scheduled procedures.  2. Diet: Do not eat solid foods after midnight.  The patient may have clear liquids until 5am upon the day of the procedure.  3. Labs: You will need to have blood drawn on  today 4. Medication instructions in preparation for your procedure:   Contrast Allergy: No   On the morning of your procedure, take your Aspirin 81 mg and any morning medicines NOT listed above.  You may use sips of water.  5. Plan to go home the same day, you will only stay overnight if medically necessary. 6. Bring a current list of your medications and current insurance cards. 7. You MUST have a responsible person to drive you home. 8. Someone MUST be with you the first 24 hours after you arrive home or your discharge will be delayed. 9. Please wear clothes that are easy to get on and off and wear slip-on shoes.  Thank you for allowing [place] to care for you!   -- [place]  In[facility] Cardi[facility]    Follow-Up: At [facility], you and your health needs are our priority.  As part of our continuing mission to provide you with exceptional heart care, we have created designated Provider Care Teams.  These Care Teams include your primary Cardiologist (physician) and Advanced Practice Providers (APPs -  Physician Assistants and Nurse Practitioners) who all work together to provide you with the care you need, when you need it.  We recommend signing up for the patient portal called "MyChart".  Sign up information is provided on this After Visit Summary.  MyChart is used to connect with patients for Virtual Visits (Telemedicine).  Patients are able to view lab/test results, encounter notes, upcoming appointments, etc.  Non-urgent  messages can be sent to your provider as well.   To learn more about what you can do with MyChart, go to [email or web address].    Your next appointment:   4 week(s)  Provider:   Thomasene Ripple, DO       Adopting a Healthy Lifestyle.  Know what a healthy weight is for you (roughly BMI <25) and aim to maintain this   Aim for 7+ servings of fruits and vegetables daily   65-80+ fluid ounces of water or unsweet tea for healthy kidneys   Limit to max 1 drink of alcohol per day; avoid smoking/tobacco   Limit animal fats in diet for cholesterol and heart health - choose grass fed whenever available   Avoid highly processed foods, and foods high in saturated/trans fats   Aim for low stress - take time to unwind and care for your mental health   Aim for 150 min of moderate intensity exercise weekly for heart health, and weights twice weekly for bone health   Aim for 7-9 hours of sleep daily   When it comes to diets, agreement about the perfect plan isnt easy to find, even among the experts. Experts at the [facility] [employer] developed an idea known as the Healthy Eating Plate. Just imagine a plate divided into  logical, healthy portions.   The emphasis is on diet quality:   Load up on vegetables and fruits - one-half of your plate: Aim for color and variety, and remember that potatoes dont count.   Go for whole grains - one-quarter of your plate: Whole wheat, barley, wheat berries, quinoa, oats, brown rice, and foods made with them. If you want pasta, go with whole wheat pasta.   Protein power - one-quarter of your plate: Fish, chicken, beans, and nuts are all healthy, versatile protein sources. Limit red meat.   The diet, however, does go beyond the plate, offering a few other suggestions.   Use healthy plant oils, such as olive, canola, soy, corn, sunflower and peanut. Check the labels, and avoid partially hydrogenated oil, which have unhealthy trans fats.   If youre thirsty, drink water. Coffee and tea are good in moderation, but skip sugary drinks and limit milk and dairy products to one or two daily servings.   The type of carbohydrate in the diet is more important than the amount. Some sources of carbohydrates, such as vegetables, fruits, whole grains, and beans-are healthier than others.   Finally, stay active  Signed, Thomasene Ripple, DO  04/17/2023 10:15 AM    [facility]

## 2023-04-18 LAB — CBC WITH DIFFERENTIAL/PLATELET
Basophils Absolute: 0 10*3/uL (ref 0.0–0.2)
Basos: 1 %
EOS (ABSOLUTE): 0.1 10*3/uL (ref 0.0–0.4)
Eos: 2 %
Hematocrit: 47.3 % (ref 37.5–51.0)
Hemoglobin: 15.8 g/dL (ref 13.0–17.7)
Immature Grans (Abs): 0 10*3/uL (ref 0.0–0.1)
Immature Granulocytes: 0 %
Lymphocytes Absolute: 1.5 10*3/uL (ref 0.7–3.1)
Lymphs: 31 %
MCH: 30.4 pg (ref 26.6–33.0)
MCHC: 33.4 g/dL (ref 31.5–35.7)
MCV: 91 fL (ref 79–97)
Monocytes Absolute: 0.6 10*3/uL (ref 0.1–0.9)
Monocytes: 13 %
Neutrophils Absolute: 2.5 10*3/uL (ref 1.4–7.0)
Neutrophils: 53 %
Platelets: 231 10*3/uL (ref 150–450)
RBC: 5.2 x10E6/uL (ref 4.14–5.80)
RDW: 13.4 % (ref 11.6–15.4)
WBC: 4.7 10*3/uL (ref 3.4–10.8)

## 2023-04-18 LAB — BASIC METABOLIC PANEL
BUN/Creatinine Ratio: 16 (ref 10–24)
BUN: 15 mg/dL (ref 8–27)
CO2: 25 mmol/L (ref 20–29)
Calcium: 9.6 mg/dL (ref 8.6–10.2)
Chloride: 104 mmol/L (ref 96–106)
Creatinine, Ser: 0.96 mg/dL (ref 0.76–1.27)
Glucose: 53 mg/dL — ABNORMAL LOW (ref 70–99)
Potassium: 4.3 mmol/L (ref 3.5–5.2)
Sodium: 144 mmol/L (ref 134–144)
eGFR: 86 mL/min/{1.73_m2} (ref >59)

## 2023-04-18 LAB — MAGNESIUM: Magnesium: 2.4 mg/dL — ABNORMAL HIGH (ref 1.6–2.3)

## 2023-04-21 ENCOUNTER — Telehealth: Payer: Self-pay | Admitting: Cardiology

## 2023-04-21 NOTE — Telephone Encounter (Signed)
Spoke to patient who was calling to get information concerning a blockage he was told he had. He wants to know what test he had that reported the blockage and which test would show a blockage. Patient stated he will call his previous doctor's office to try to find the information.

## 2023-04-21 NOTE — Telephone Encounter (Signed)
Patient called. Informed him of previous testing and comments from dr. Servando Salina. Scheduled 4wk f/u post cath. Patient verbalized understanding of upcoming appts. No further questions or concerns at this time.

## 2023-04-21 NOTE — Telephone Encounter (Signed)
Patient states he had a test done and he was informed that he had blockages, but he states no one mentioned the type of test he had done. He would like a call back to discuss further and to know how he can access this information in the future.

## 2023-04-22 ENCOUNTER — Telehealth: Payer: Self-pay | Admitting: *Deleted

## 2023-04-22 NOTE — Telephone Encounter (Signed)
Cardiac Catheterization scheduled at [facility] for: Wednesday April 23, 2023 11:30 AM Arrival time [facility] Main Entrance A at: 9:30 AM  Nothing to eat after midnight prior to procedure, clear liquids until 5 AM day of procedure.  Medication instructions: -Usual morning medications can be taken with sips of water including aspirin 81 mg.  Plan to go home the same day, you will only stay overnight if medically necessary.  You must have responsible adult to drive you home.  Someone must be with you the first 24 hours after you arrive home.  Reviewed procedure instructions with patient.

## 2023-04-23 ENCOUNTER — Other Ambulatory Visit: Payer: Self-pay

## 2023-04-23 ENCOUNTER — Encounter (HOSPITAL_COMMUNITY)
Admission: RE | Disposition: A | Discharge status: Home / Self Care | Payer: Self-pay | Source: Home / Self Care | Attending: Cardiovascular Disease

## 2023-04-23 ENCOUNTER — Encounter (HOSPITAL_COMMUNITY): Payer: Self-pay | Admitting: Cardiovascular Disease

## 2023-04-23 ENCOUNTER — Ambulatory Visit (HOSPITAL_COMMUNITY)
Admission: RE | Admit: 2023-04-23 | Discharge: 2023-04-23 | Disposition: A | Discharge status: Home / Self Care | Payer: Medicare Other | Attending: Cardiovascular Disease | Admitting: Cardiovascular Disease

## 2023-04-23 DIAGNOSIS — E782 Mixed hyperlipidemia: Secondary | ICD-10-CM | POA: Diagnosis not present

## 2023-04-23 DIAGNOSIS — I25110 Atherosclerotic heart disease of native coronary artery with unstable angina pectoris: Secondary | ICD-10-CM | POA: Diagnosis not present

## 2023-04-23 DIAGNOSIS — I2584 Coronary atherosclerosis due to calcified coronary lesion: Secondary | ICD-10-CM | POA: Diagnosis not present

## 2023-04-23 DIAGNOSIS — I25119 Atherosclerotic heart disease of native coronary artery with unspecified angina pectoris: Secondary | ICD-10-CM | POA: Diagnosis not present

## 2023-04-23 DIAGNOSIS — I480 Paroxysmal atrial fibrillation: Secondary | ICD-10-CM | POA: Insufficient documentation

## 2023-04-23 DIAGNOSIS — Z79899 Other long term (current) drug therapy: Secondary | ICD-10-CM | POA: Diagnosis not present

## 2023-04-23 DIAGNOSIS — I10 Essential (primary) hypertension: Secondary | ICD-10-CM | POA: Insufficient documentation

## 2023-04-23 HISTORY — PX: CORONARY PRESSURE/FFR STUDY: CATH118243

## 2023-04-23 HISTORY — PX: LEFT HEART CATH AND CORONARY ANGIOGRAPHY: CATH118249

## 2023-04-23 LAB — POCT ACTIVATED CLOTTING TIME: Activated Clotting Time: 233 s

## 2023-04-23 LAB — GLUCOSE, CAPILLARY: Glucose-Capillary: 75 mg/dL (ref 70–99)

## 2023-04-23 SURGERY — LEFT HEART CATH AND CORONARY ANGIOGRAPHY
Anesthesia: LOCAL

## 2023-04-23 MED ORDER — HEPARIN SODIUM (PORCINE) 1000 UNIT/ML IJ SOLN
INTRAMUSCULAR | Status: AC
  Filled 2023-04-23: qty 10

## 2023-04-23 MED ORDER — FENTANYL CITRATE (PF) 100 MCG/2ML IJ SOLN
INTRAMUSCULAR | Status: AC
  Filled 2023-04-23: qty 2

## 2023-04-23 MED ORDER — ASPIRIN 81 MG PO CHEW: 81.0000 mg | CHEWABLE_TABLET | ORAL | Status: DC

## 2023-04-23 MED ORDER — LABETALOL HCL 5 MG/ML IV SOLN: 10.0000 mg | INTRAVENOUS | Status: DC | PRN

## 2023-04-23 MED ORDER — SODIUM CHLORIDE 0.9 % WEIGHT BASED INFUSION: 1.0000 mL/kg/h | INTRAVENOUS | Status: DC

## 2023-04-23 MED ORDER — SODIUM CHLORIDE 0.9% FLUSH: 3.0000 mL | INTRAVENOUS | Status: DC | PRN

## 2023-04-23 MED ORDER — FENTANYL CITRATE (PF) 100 MCG/2ML IJ SOLN
INTRAMUSCULAR | Status: DC | PRN
  Administered 2023-04-23: 25 ug via INTRAVENOUS

## 2023-04-23 MED ORDER — HYDRALAZINE HCL 20 MG/ML IJ SOLN: 10.0000 mg | INTRAMUSCULAR | Status: DC | PRN

## 2023-04-23 MED ORDER — SODIUM CHLORIDE 0.9 % WEIGHT BASED INFUSION
3.0000 mL/kg/h | INTRAVENOUS | Status: AC
  Administered 2023-04-23: 3 mL/kg/h via INTRAVENOUS

## 2023-04-23 MED ORDER — IOHEXOL 350 MG/ML SOLN
INTRAVENOUS | Status: DC | PRN
  Administered 2023-04-23: 40 mL

## 2023-04-23 MED ORDER — LIDOCAINE HCL (PF) 1 % IJ SOLN
INTRAMUSCULAR | Status: DC | PRN
  Administered 2023-04-23: 2 mL

## 2023-04-23 MED ORDER — VERAPAMIL HCL 2.5 MG/ML IV SOLN
INTRAVENOUS | Status: DC | PRN
  Administered 2023-04-23: 10 mL via INTRA_ARTERIAL

## 2023-04-23 MED ORDER — HEPARIN SODIUM (PORCINE) 1000 UNIT/ML IJ SOLN
INTRAMUSCULAR | Status: DC | PRN
  Administered 2023-04-23 (×2): 4000 [IU] via INTRAVENOUS

## 2023-04-23 MED ORDER — ACETAMINOPHEN 325 MG PO TABS: 650.0000 mg | ORAL_TABLET | ORAL | Status: DC | PRN

## 2023-04-23 MED ORDER — SODIUM CHLORIDE 0.9 % IV SOLN: 250.0000 mL | INTRAVENOUS | Status: DC | PRN

## 2023-04-23 MED ORDER — MIDAZOLAM HCL 2 MG/2ML IJ SOLN
INTRAMUSCULAR | Status: DC | PRN
  Administered 2023-04-23: 2 mg via INTRAVENOUS

## 2023-04-23 MED ORDER — VERAPAMIL HCL 2.5 MG/ML IV SOLN
INTRAVENOUS | Status: AC
  Filled 2023-04-23: qty 2

## 2023-04-23 MED ORDER — SODIUM CHLORIDE 0.9% FLUSH: 3.0000 mL | Freq: Two times a day (BID) | INTRAVENOUS | Status: DC

## 2023-04-23 MED ORDER — HEPARIN (PORCINE) IN NACL 1000-0.9 UT/500ML-% IV SOLN
INTRAVENOUS | Status: DC | PRN
  Administered 2023-04-23 (×3): 500 mL

## 2023-04-23 MED ORDER — ONDANSETRON HCL 4 MG/2ML IJ SOLN: 4.0000 mg | Freq: Four times a day (QID) | INTRAMUSCULAR | Status: DC | PRN

## 2023-04-23 MED ORDER — LIDOCAINE HCL (PF) 1 % IJ SOLN
INTRAMUSCULAR | Status: AC
  Filled 2023-04-23: qty 30

## 2023-04-23 MED ORDER — MIDAZOLAM HCL 2 MG/2ML IJ SOLN
INTRAMUSCULAR | Status: AC
  Filled 2023-04-23: qty 2

## 2023-04-23 SURGICAL SUPPLY — 11 items
CATH 5FR JL3.5 JR4 ANG PIG MP (CATHETERS) IMPLANT
CATH LAUNCHER 5F EBU3.5 (CATHETERS) IMPLANT
DEVICE RAD COMP TR BAND LRG (VASCULAR PRODUCTS) IMPLANT
GLIDESHEATH SLEND SS 6F .021 (SHEATH) IMPLANT
GUIDEWIRE INQWIRE 1.5J.035X260 (WIRE) IMPLANT
GUIDEWIRE PRESSURE X 175 (WIRE) IMPLANT
INQWIRE 1.5J .035X260CM (WIRE) ×1
KIT SYRINGE INJ CVI SPIKEX1 (MISCELLANEOUS) IMPLANT
PACK CARDIAC CATHETERIZATION (CUSTOM PROCEDURE TRAY) ×1 IMPLANT
SET ATX-X65L (MISCELLANEOUS) IMPLANT
TUBING CIL FLEX 10 FLL-RA (TUBING) IMPLANT

## 2023-04-23 NOTE — Interval H&P Note (Signed)
History and Physical Interval Note:  04/23/2023 10:34 AM  Christopher Duarte  has presented today for surgery, with the diagnosis of CAD.  The various methods of treatment have been discussed with the patient and family. After consideration of risks, benefits and other options for treatment, the patient has consented to  Procedure(s): LEFT HEART CATH AND CORONARY ANGIOGRAPHY (N/A) as a surgical intervention.  The patient's history has been reviewed, patient examined, no change in status, stable for surgery.  I have reviewed the patient's chart and labs.  Questions were answered to the patient's satisfaction.     Tonny Bollman

## 2023-04-23 NOTE — Discharge Instructions (Signed)

## 2023-04-30 DIAGNOSIS — I10 Essential (primary) hypertension: Secondary | ICD-10-CM | POA: Diagnosis not present

## 2023-04-30 DIAGNOSIS — E785 Hyperlipidemia, unspecified: Secondary | ICD-10-CM | POA: Diagnosis not present

## 2023-04-30 DIAGNOSIS — Z139 Encounter for screening, unspecified: Secondary | ICD-10-CM | POA: Diagnosis not present

## 2023-04-30 DIAGNOSIS — G2581 Restless legs syndrome: Secondary | ICD-10-CM | POA: Diagnosis not present

## 2023-05-21 ENCOUNTER — Encounter: Payer: Self-pay | Admitting: Cardiology

## 2023-05-21 ENCOUNTER — Ambulatory Visit: Payer: Medicare Other | Attending: Cardiology | Admitting: Cardiology

## 2023-05-21 VITALS — BP 132/68 | HR 77 | Ht 68.0 in | Wt 137.0 lb

## 2023-05-21 DIAGNOSIS — I2510 Atherosclerotic heart disease of native coronary artery without angina pectoris: Secondary | ICD-10-CM | POA: Diagnosis not present

## 2023-05-21 DIAGNOSIS — E782 Mixed hyperlipidemia: Secondary | ICD-10-CM

## 2023-05-21 DIAGNOSIS — I10 Essential (primary) hypertension: Secondary | ICD-10-CM

## 2023-05-21 DIAGNOSIS — I480 Paroxysmal atrial fibrillation: Secondary | ICD-10-CM | POA: Diagnosis not present

## 2023-05-21 MED ORDER — METOPROLOL SUCCINATE ER 25 MG PO TB24: 12.5000 mg | ORAL_TABLET | Freq: Every day | ORAL | 3 refills | Status: DC

## 2023-05-21 NOTE — Progress Notes (Signed)
Cardiology Office Note:    Date:  05/21/2023   ID:  Christopher Duarte, DOB [birth year], MRN [id number]  PCP:  Wilmer Floor., MD  Cardiologist:  Thomasene Ripple, DO  Electrophysiologist:  Regan Lemming, MD   Referring MD: Wilmer Floor., MD   "I am having chest pain"  History of Present Illness:    Christopher Duarte is a 67 y.o. male with a hx of atrial fibrillation status post ablation he stopped the Eliquis due to his preference despite CHA2DS2-VASc score being 2 coronary artery disease, hypertension, hyperlipidemia.  Patient had no follow-up since 2022.  At his recent visit in 04/2023 we discussed his  information for a ZIO monitor that he had placed on at the ED at [facility].  He complained of chest pain and sent for LHC due to previous diagnosis of CAD.   Cath did not require PCI.   He reports feeling tired 'pretty much all the time' due to poor sleep quality, which he attributes to his heart beating hard. He typically sleeps for three to four hours before waking up due to his heart beating hard. Despite these symptoms, all his recent cardiac tests, including a heart catheterization and a heart monitor, have been normal.  The patient has recently stopped taking clonazepam and ropinirole on the advice of his daughter, a nurse, and is currently only taking a baby aspirin. He previously took metoprolol, but stopped due to low blood pressure. He also reports having aches and pains, which he attributes to getting older. He expresses a desire to feel good and is open to trying new medications to manage his symptoms.   Past Medical History:  Diagnosis Date   Anemia 01/02/2013   Anxiety 07/02/2013   Arrhythmia    Benign prostatic hyperplasia 01/04/2013   Depression 01/02/2013   Hyperlipidemia    Hypertension    Kidney stones 01/02/2013   Major depression    Palpitation 01/02/2013   Prostatitis 01/02/2013   Pyelonephritis 01/02/2013    Past Surgical History:  Procedure  Laterality Date   ATRIAL FIBRILLATION ABLATION N/A 11/29/2020   Procedure: ATRIAL FIBRILLATION ABLATION;  Surgeon: Regan Lemming, MD;  Location: [facility] INVASIVE CV LAB;  Service: Cardiovascular;  Laterality: N/A;   CORONARY PRESSURE/FFR STUDY N/A 04/23/2023   Procedure: CORONARY PRESSURE/FFR STUDY;  Surgeon: Tonny Bollman, MD;  Location: [facility] INVASIVE CV LAB;  Service: Cardiovascular;  Laterality: N/A;   LEFT HEART CATH AND CORONARY ANGIOGRAPHY N/A 04/23/2023   Procedure: LEFT HEART CATH AND CORONARY ANGIOGRAPHY;  Surgeon: Tonny Bollman, MD;  Location: [facility] INVASIVE CV LAB;  Service: Cardiovascular;  Laterality: N/A;    Current Medications: Current Meds  Medication Sig   acetaminophen (TYLENOL) 500 MG tablet Take 1,000 mg by mouth every 8 (eight) hours as needed for moderate pain (pain score 4-6).   aspirin EC 81 MG tablet Take 81 mg by mouth daily. Swallow whole.   atorvastatin (LIPITOR) 10 MG tablet Take 10 mg by mouth daily.   ibuprofen (ADVIL) 200 MG tablet Take 400 mg by mouth every 6 (six) hours as needed for headache or moderate pain.   metoprolol succinate (TOPROL XL) 25 MG 24 hr tablet Take 0.5 tablets (12.5 mg total) by mouth daily.     Allergies:   Patient has no known allergies.   Social History   Socioeconomic History   Marital status: Married    Spouse name: Not on file   Number of children: Not on file   Years  of education: Not on file   Highest education level: Not on file  Occupational History   Not on file  Tobacco Use   Smoking status: Never   Smokeless tobacco: Never  Substance and Sexual Activity   Alcohol use: Not Currently    Comment: Occasional   Drug use: Never   Sexual activity: Not on file  Other Topics Concern   Not on file  Social History Narrative   Not on file   Social Determinants of Health   Financial Resource Strain: Not on file  Food Insecurity: Not on file  Transportation Needs: Not on file  Physical Activity: Not on file   Stress: Not on file  Social Connections: Not on file     Family History: The patient's family history includes Prostate cancer in his father; Skin cancer in his mother.  ROS:   Review of Systems  Constitution: Negative for decreased appetite, fever and weight gain.  HENT: Negative for congestion, ear discharge, hoarse voice and sore throat.   Eyes: Negative for discharge, redness, vision loss in right eye and visual halos.  Cardiovascular: Negative for chest pain, dyspnea on exertion, leg swelling, orthopnea and palpitations.  Respiratory: Negative for cough, hemoptysis, shortness of breath and snoring.   Endocrine: Negative for heat intolerance and polyphagia.  Hematologic/Lymphatic: Negative for bleeding problem. Does not bruise/bleed easily.  Skin: Negative for flushing, nail changes, rash and suspicious lesions.  Musculoskeletal: Negative for arthritis, joint pain, muscle cramps, myalgias, neck pain and stiffness.  Gastrointestinal: Negative for abdominal pain, bowel incontinence, diarrhea and excessive appetite.  Genitourinary: Negative for decreased libido, genital sores and incomplete emptying.  Neurological: Negative for brief paralysis, focal weakness, headaches and loss of balance.  Psychiatric/Behavioral: Negative for altered mental status, depression and suicidal ideas.  Allergic/Immunologic: Negative for HIV exposure and persistent infections.    EKGs/Labs/Other Studies Reviewed:    The following studies were reviewed today:   EKG: none today   Recent Labs: 04/17/2023: BUN 15; Creatinine, Ser 0.96; Hemoglobin 15.8; Magnesium 2.4; Platelets 231; Potassium 4.3; Sodium 144  Recent Lipid Panel No results found for: "CHOL", "TRIG", "HDL", "CHOLHDL", "VLDL", "LDLCALC", "LDLDIRECT"  Physical Exam:    VS:  BP 132/68 (BP Location: Right Arm, Patient Position: Sitting, Cuff Size: Normal)   Pulse 77   Ht 5\' 8"  (1.727 m)   Wt 137 lb (62.1 kg)   SpO2 95%   BMI 20.83 kg/m      Wt Readings from Last 3 Encounters:  05/21/23 137 lb (62.1 kg)  04/23/23 135 lb (61.2 kg)  04/17/23 135 lb 12.8 oz (61.6 kg)     GEN: Well nourished, well developed in no acute distress HEENT: Normal NECK: No JVD; No carotid bruits LYMPHATICS: No lymphadenopathy CARDIAC: S1S2 noted,RRR, no murmurs, rubs, gallops RESPIRATORY:  Clear to auscultation without rales, wheezing or rhonchi  ABDOMEN: Soft, non-tender, non-distended, +bowel sounds, no guarding. EXTREMITIES: No edema, No cyanosis, no clubbing MUSCULOSKELETAL:  No deformity  SKIN: Warm and dry NEUROLOGIC:  Alert and oriented x 3, non-focal PSYCHIATRIC:  Normal affect, good insight  ASSESSMENT:    1. Coronary artery disease involving native coronary artery of native heart without angina pectoris   2. PAF (paroxysmal atrial fibrillation) (HCC)   3. Essential hypertension   4. Mixed hyperlipidemia     PLAN:    Coronary Artery Disease with suspected angina - recent cath no indication for PCI.  Cont current medication regimen. Contue ASA 81mg  daily, and lipitor   -Atrial fibrillation -  paroxysmal, has not been taking the lopressor due to his blood pressure being low. His palpitations may be intermittent AFIb.  Will restart bb, toprol xl 12.5 mg daily.  Continue low-dose Lopressor 12.5 mg twice daily. CHADSvasc score is 2 patient preference declines anticoagulation.  Blood pressure is acceptable, continue with current antihypertensive regimen.  Hyperlipidemia - continue with current statin medication.    The patient is in agreement with the above plan. The patient left the office in stable condition.  The patient will follow up in   Medication Adjustments/Labs and Tests Ordered: Current medicines are reviewed at length with the patient today.  Concerns regarding medicines are outlined above.  No orders of the defined types were placed in this encounter.  Meds ordered this encounter  Medications   metoprolol  succinate (TOPROL XL) 25 MG 24 hr tablet    Sig: Take 0.5 tablets (12.5 mg total) by mouth daily.    Dispense:  45 tablet    Refill:  3    Patient Instructions  Medication Instructions:  Your physician has recommended you make the following change in your medication:  START: Metoprolol succinate (Toprol- XL) 12.5 mg nightly (2 hours before bedtime)  *If you need a refill on your cardiac medications before your next appointment, please call your pharmacy*   Follow-Up: At [facility], you and your health needs are our priority.  As part of our continuing mission to provide you with exceptional heart care, we have created designated Provider Care Teams.  These Care Teams include your primary Cardiologist (physician) and Advanced Practice Providers (APPs -  Physician Assistants and Nurse Practitioners) who all work together to provide you with the care you need, when you need it.    Your next appointment:   6 month(s)  Provider:   Thomasene Ripple, DO     Adopting a Healthy Lifestyle.  Know what a healthy weight is for you (roughly BMI <25) and aim to maintain this   Aim for 7+ servings of fruits and vegetables daily   65-80+ fluid ounces of water or unsweet tea for healthy kidneys   Limit to max 1 drink of alcohol per day; avoid smoking/tobacco   Limit animal fats in diet for cholesterol and heart health - choose grass fed whenever available   Avoid highly processed foods, and foods high in saturated/trans fats   Aim for low stress - take time to unwind and care for your mental health   Aim for 150 min of moderate intensity exercise weekly for heart health, and weights twice weekly for bone health   Aim for 7-9 hours of sleep daily   When it comes to diets, agreement about the perfect plan isnt easy to find, even among the experts. Experts at the [facility] [employer] developed an idea known as the Healthy Eating Plate. Just imagine a plate divided into  logical, healthy portions.   The emphasis is on diet quality:   Load up on vegetables and fruits - one-half of your plate: Aim for color and variety, and remember that potatoes dont count.   Go for whole grains - one-quarter of your plate: Whole wheat, barley, wheat berries, quinoa, oats, brown rice, and foods made with them. If you want pasta, go with whole wheat pasta.   Protein power - one-quarter of your plate: Fish, chicken, beans, and nuts are all healthy, versatile protein sources. Limit red meat.   The diet, however, does go beyond the plate, offering a  few other suggestions.   Use healthy plant oils, such as olive, canola, soy, corn, sunflower and peanut. Check the labels, and avoid partially hydrogenated oil, which have unhealthy trans fats.   If youre thirsty, drink water. Coffee and tea are good in moderation, but skip sugary drinks and limit milk and dairy products to one or two daily servings.   The type of carbohydrate in the diet is more important than the amount. Some sources of carbohydrates, such as vegetables, fruits, whole grains, and beans-are healthier than others.   Finally, stay active  Signed, Thomasene Ripple, DO  05/21/2023 10:47 AM    [facility]

## 2023-05-21 NOTE — Patient Instructions (Addendum)
Medication Instructions:  Your physician has recommended you make the following change in your medication:  START: Metoprolol succinate (Toprol- XL) 12.5 mg nightly (2 hours before bedtime)  *If you need a refill on your cardiac medications before your next appointment, please call your pharmacy*   Follow-Up: At [facility], you and your health needs are our priority.  As part of our continuing mission to provide you with exceptional heart care, we have created designated Provider Care Teams.  These Care Teams include your primary Cardiologist (physician) and Advanced Practice Providers (APPs -  Physician Assistants and Nurse Practitioners) who all work together to provide you with the care you need, when you need it.    Your next appointment:   6 month(s)  Provider:   Thomasene Ripple, DO

## 2023-10-29 DIAGNOSIS — Z139 Encounter for screening, unspecified: Secondary | ICD-10-CM | POA: Diagnosis not present

## 2023-10-29 DIAGNOSIS — G2581 Restless legs syndrome: Secondary | ICD-10-CM | POA: Diagnosis not present

## 2023-10-29 DIAGNOSIS — I10 Essential (primary) hypertension: Secondary | ICD-10-CM | POA: Diagnosis not present

## 2023-10-29 DIAGNOSIS — E785 Hyperlipidemia, unspecified: Secondary | ICD-10-CM | POA: Diagnosis not present

## 2023-10-29 DIAGNOSIS — M65341 Trigger finger, right ring finger: Secondary | ICD-10-CM | POA: Diagnosis not present

## 2023-10-31 DIAGNOSIS — M720 Palmar fascial fibromatosis [Dupuytren]: Secondary | ICD-10-CM | POA: Diagnosis not present

## 2023-11-14 ENCOUNTER — Encounter: Payer: Self-pay | Admitting: Cardiology

## 2023-12-03 DIAGNOSIS — H43393 Other vitreous opacities, bilateral: Secondary | ICD-10-CM | POA: Diagnosis not present

## 2023-12-03 DIAGNOSIS — H2513 Age-related nuclear cataract, bilateral: Secondary | ICD-10-CM | POA: Diagnosis not present

## 2024-02-25 DIAGNOSIS — E785 Hyperlipidemia, unspecified: Secondary | ICD-10-CM | POA: Diagnosis not present

## 2024-02-25 DIAGNOSIS — I480 Paroxysmal atrial fibrillation: Secondary | ICD-10-CM | POA: Diagnosis not present

## 2024-03-01 NOTE — Progress Notes (Unsigned)
  Electrophysiology Office Note:   Date:  03/04/2024  ID:  Christopher Duarte, DOB 1955-10-17, MRN [id number]  Primary Cardiologist: Christopher Huntsman, DO Primary Heart Failure: None Electrophysiologist: Christopher Sardina [name], MD      History of Present Illness:   Christopher Duarte is a 68 y.o. male with h/o atrial fibrillation, hypertension, hyperlipidemia,, nonobstructive coronary artery disease, BPH seen today for routine electrophysiology followup.   Since last being seen in our clinic the patient reports doing well.  He has no chest pain and no shortness of breath.  He is able to do all of his daily activities.  He started exercising.  He has noted no further episodes of atrial fibrillation.  he denies chest pain, palpitations, dyspnea, PND, orthopnea, nausea, vomiting, dizziness, syncope, edema, weight gain, or early satiety.   Review of systems complete and found to be negative unless listed in HPI.   EP Information / Studies Reviewed:    EKG is not ordered today. EKG from 05/04/2024 reviewed which showed sinus rhythm        Risk Assessment/Calculations:    CHA2DS2-VASc Score = 3   This indicates a 3.2% annual risk of stroke. The patient's score is based upon: CHF History: 0 HTN History: 1 Diabetes History: 0 Stroke History: 0 Vascular Disease History: 1 Age Score: 1 Gender Score: 0            Physical Exam:   VS:  BP (!) 158/72   Pulse 75   Ht 5' 8 (1.727 m)   Wt 142 lb (64.4 kg)   SpO2 98%   BMI 21.59 kg/m    Wt Readings from Last 3 Encounters:  03/04/24 142 lb (64.4 kg)  03/04/24 142 lb 9.6 oz (64.7 kg)  05/21/23 137 lb (62.1 kg)     GEN: Well nourished, well developed in no acute distress NECK: No JVD; No carotid bruits CARDIAC: Regular rate and rhythm, no murmurs, rubs, gallops RESPIRATORY:  Clear to auscultation without rales, wheezing or rhonchi  ABDOMEN: Soft, non-tender, non-distended EXTREMITIES:  No edema; No deformity   ASSESSMENT AND PLAN:    1.   Paroxysmal atrial fibrillation: Post ablation 11/29/2020.  Eliquis  has been stopped patient preference.  He has not had any episodes of atrial fibrillation since his ablation.  He has done well over the last year.  For now, he Christopher Duarte call us  back if he has any recurrences of his arrhythmia.  2.  Hypertension: Elevated today.  Managed by primary cardiology.  3.  Coronary disease: Recent left heart catheterization with nonobstructive disease.  Plan per primary cardiology.   Follow up with Dr. Norton if arrhythmia recurs   Signed, Christopher Duarte [name], MD

## 2024-03-04 ENCOUNTER — Ambulatory Visit

## 2024-03-04 ENCOUNTER — Ambulatory Visit: Attending: Cardiology | Admitting: Cardiology

## 2024-03-04 ENCOUNTER — Encounter: Payer: Self-pay | Admitting: Cardiology

## 2024-03-04 ENCOUNTER — Other Ambulatory Visit (HOSPITAL_COMMUNITY): Payer: Self-pay

## 2024-03-04 VITALS — BP 158/72 | HR 75 | Ht 68.0 in | Wt 142.0 lb

## 2024-03-04 VITALS — BP 174/86 | HR 70 | Ht 68.0 in | Wt 142.6 lb

## 2024-03-04 DIAGNOSIS — I480 Paroxysmal atrial fibrillation: Secondary | ICD-10-CM

## 2024-03-04 DIAGNOSIS — I2510 Atherosclerotic heart disease of native coronary artery without angina pectoris: Secondary | ICD-10-CM | POA: Diagnosis not present

## 2024-03-04 DIAGNOSIS — Z79899 Other long term (current) drug therapy: Secondary | ICD-10-CM

## 2024-03-04 DIAGNOSIS — E782 Mixed hyperlipidemia: Secondary | ICD-10-CM | POA: Diagnosis not present

## 2024-03-04 DIAGNOSIS — I10 Essential (primary) hypertension: Secondary | ICD-10-CM | POA: Diagnosis not present

## 2024-03-04 MED ORDER — CARVEDILOL 6.25 MG PO TABS
6.2500 mg | ORAL_TABLET | Freq: Two times a day (BID) | ORAL | 3 refills | Status: DC
  Filled 2024-03-04: qty 180, 90d supply, fill #0

## 2024-03-04 MED ORDER — BLOOD PRESSURE MONITOR AUTOMAT DEVI
1.0000 [IU] | Freq: Once | 0 refills | Status: AC
  Filled 2024-03-04: qty 1, 30d supply, fill #0

## 2024-03-04 NOTE — Patient Instructions (Signed)
 Medication Instructions:  Your physician has recommended you make the following change in your medication: START: Coreg  6.25 mg twice daily   Please take your blood pressure daily for 2 weeks and send in a MyChart message. Please include heart rates. (One message at the end of the 2 weeks).   HOW TO TAKE YOUR BLOOD PRESSURE: Rest 5 minutes before taking your blood pressure. Don't smoke or drink caffeinated beverages for at least 30 minutes before. Take your blood pressure before (not after) you eat. Sit comfortably with your back supported and both feet on the floor (don't cross your legs). Elevate your arm to heart level on a table or a desk. Use the proper sized cuff. It should fit smoothly and snugly around your bare upper arm. There should be enough room to slip a fingertip under the cuff. The bottom edge of the cuff should be 1 inch above the crease of the elbow. Ideally, take 3 measurements at one sitting and record the average.  *If you need a refill on your cardiac medications before your next appointment, please call your pharmacy*  Lab Work: CMET, Mag If you have labs (blood work) drawn today and your tests are completely normal, you will receive your results only by: MyChart Message (if you have MyChart) OR A paper copy in the mail If you have any lab test that is abnormal or we need to change your treatment, we will call you to review the results.  Testing/Procedures: GEOFFRY HEWS- Long Term Monitor Instructions  Your physician has requested you wear a ZIO patch monitor for 14 days.  This is a single patch monitor. [employer] supplies one patch monitor per enrollment. Additional stickers are not available. Please do not apply patch if you will be having a Nuclear Stress Test,  Echocardiogram, Cardiac CT, MRI, or Chest Xray during the period you would be wearing the  monitor. The patch cannot be worn during these tests. You cannot remove and re-apply the  ZIO XT patch monitor.  Your  ZIO patch monitor will be mailed 3 day USPS to your address on file. It may take 3-5 days  to receive your monitor after you have been enrolled.  Once you have received your monitor, please review the enclosed instructions. Your monitor  has already been registered assigning a specific monitor serial # to you.  Billing and Patient Assistance Program Information  We have supplied [employer] with any of your insurance information on file for billing purposes. [employer] offers a sliding scale Patient Assistance Program for patients that do not have  insurance, or whose insurance does not completely cover the cost of the ZIO monitor.  You must apply for the Patient Assistance Program to qualify for this discounted rate.  To apply, please call [employer] at [phone], select option 4, select option 2, ask to apply for  Patient Assistance Program. Meredeth will ask your household income, and how many people  are in your household. They will quote your out-of-pocket cost based on that information.  [employer] will also be able to set up a 27-month, interest-free payment plan if needed.  Applying the monitor   Shave hair from upper left chest.  Hold abrader disc by orange tab. Rub abrader in 40 strokes over the upper left chest as  indicated in your monitor instructions.  Clean area with 4 enclosed alcohol pads. Let dry.  Apply patch as indicated in monitor instructions. Patch will be placed under collarbone on left  side of chest with  arrow pointing upward.  Rub patch adhesive wings for 2 minutes. Remove white label marked 1. Remove the white  label marked 2. Rub patch adhesive wings for 2 additional minutes.  While looking in a mirror, press and release button in center of patch. A small green light will  flash 3-4 times. This will be your only indicator that the monitor has been turned on.  Do not shower for the first 24 hours. You may shower after the first 24 hours.  Press the button if you feel  a symptom. You will hear a small click. Record Date, Time and  Symptom in the Patient Logbook.  When you are ready to remove the patch, follow instructions on the last 2 pages of Patient  Logbook. Stick patch monitor onto the last page of Patient Logbook.  Place Patient Logbook in the blue and white box. Use locking tab on box and tape box closed  securely. The blue and white box has prepaid postage on it. Please place it in the mailbox as  soon as possible. Your physician should have your test results approximately 7 days after the  monitor has been mailed back to [facility].  Call [facility] Customer Care at [phone] if you have questions regarding  your ZIO XT patch monitor. Call them immediately if you see an orange light blinking on your  monitor.  If your monitor falls off in less than 4 days, contact our Monitor department at [phone].  If your monitor becomes loose or falls off after 4 days call [employer] at [phone] for  suggestions on securing your monitor   Follow-Up: At [facility], you and your health needs are our priority.  As part of our continuing mission to provide you with exceptional heart care, our providers are all part of one team.  This team includes your primary Cardiologist (physician) and Advanced Practice Providers or APPs (Physician Assistants and Nurse Practitioners) who all work together to provide you with the care you need, when you need it.  Your next appointment:   2-4 week with an APP or Pharm-D  then 6 month(s)  Provider:   Kardie Tobb, DO     Other Instructions Please see our pharmacy staff or an APP in 2-4 weeks (blood pressure follow up).

## 2024-03-04 NOTE — Progress Notes (Unsigned)
 Enrolled patient for a 14 day Zio XT  monitor to be mailed to patients home

## 2024-03-04 NOTE — Progress Notes (Signed)
 Cardiology Office Note:    Date:  03/06/2024   ID:  Christopher Duarte, DOB 1956/02/11, MRN [id number]  PCP:  Elaine Garnette BIRCH., MD  Cardiologist:  Dub Huntsman, DO  Electrophysiologist:  Soyla [name], MD   Referring MD: Elaine Garnette BIRCH., MD   I am having chest pain  History of Present Illness:    Christopher Duarte is a 68 y.o. male with a hx of atrial fibrillation status post ablation he stopped the Eliquis  due to his preference despite CHA2DS2-VASc score being 2 coronary artery disease, hypertension, hyperlipidemia.  At his last visit with me he was experiencing some chest discomfort given the fact that he did have some LAD disease on his coronary CTA as in the patient for left heart catheterization which did not show any significant stenosis that required intervention.  Since that time he has been doing well.  He has followed with his PCP and has been started on sildenafil, as a result of that he tells me that his blood pressure at that time was on the lower side so he stopped the metoprolol .  He reports that the anniversary of his son overdose and death is coming up and he has been slightly depressed lately.  But he has also been active with helping with taking care of his grandson.  He notes some fatigue as well  Past Medical History:  Diagnosis Date   Anemia 01/02/2013   Anxiety 07/02/2013   Arrhythmia    Benign prostatic hyperplasia 01/04/2013   Depression 01/02/2013   Hyperlipidemia    Hypertension    Kidney stones 01/02/2013   Major depression    Palpitation 01/02/2013   Prostatitis 01/02/2013   Pyelonephritis 01/02/2013    Past Surgical History:  Procedure Laterality Date   ATRIAL FIBRILLATION ABLATION N/A 11/29/2020   Procedure: ATRIAL FIBRILLATION ABLATION;  Surgeon: Norton Soyla Gladis, MD;  Location: [facility] INVASIVE CV LAB;  Service: Cardiovascular;  Laterality: N/A;   CORONARY PRESSURE/FFR STUDY N/A 04/23/2023   Procedure: CORONARY PRESSURE/FFR STUDY;  Surgeon:  Wonda Sharper, MD;  Location: [facility] INVASIVE CV LAB;  Service: Cardiovascular;  Laterality: N/A;   LEFT HEART CATH AND CORONARY ANGIOGRAPHY N/A 04/23/2023   Procedure: LEFT HEART CATH AND CORONARY ANGIOGRAPHY;  Surgeon: Wonda Sharper, MD;  Location: [facility] INVASIVE CV LAB;  Service: Cardiovascular;  Laterality: N/A;    Current Medications: Current Meds  Medication Sig   acetaminophen  (TYLENOL ) 500 MG tablet Take 1,000 mg by mouth every 8 (eight) hours as needed for moderate pain (pain score 4-6).   aspirin  EC 81 MG tablet Take 81 mg by mouth daily. Swallow whole.   atorvastatin (LIPITOR) 10 MG tablet Take 10 mg by mouth daily.   [EXPIRED] Blood Pressure Monitoring (BLOOD PRESSURE MONITOR AUTOMAT) DEVI Use to check blood pressure.   carvedilol  (COREG ) 6.25 MG tablet Take 1 tablet (6.25 mg total) by mouth 2 (two) times daily.   ibuprofen (ADVIL) 200 MG tablet Take 400 mg by mouth every 6 (six) hours as needed for headache or moderate pain.   sildenafil (VIAGRA) 100 MG tablet Take 100 mg by mouth daily.   [DISCONTINUED] metoprolol  succinate (TOPROL  XL) 25 MG 24 hr tablet Take 0.5 tablets (12.5 mg total) by mouth daily.     Allergies:   Patient has no known allergies.   Social History   Socioeconomic History   Marital status: Married    Spouse name: Not on file   Number of children: Not on file   Years of  education: Not on file   Highest education level: Not on file  Occupational History   Not on file  Tobacco Use   Smoking status: Never   Smokeless tobacco: Never  Substance and Sexual Activity   Alcohol use: Not Currently    Comment: Occasional   Drug use: Never   Sexual activity: Not on file  Other Topics Concern   Not on file  Social History Narrative   Not on file   Social Drivers of Health   Financial Resource Strain: Not on file  Food Insecurity: Not on file  Transportation Needs: Not on file  Physical Activity: Not on file  Stress: Not on file  Social Connections:  Not on file     Family History: The patient's family history includes Prostate cancer in his father; Skin cancer in his mother.  ROS:   Review of Systems  Constitution: Negative for decreased appetite, fever and weight gain.  HENT: Negative for congestion, ear discharge, hoarse voice and sore throat.   Eyes: Negative for discharge, redness, vision loss in right eye and visual halos.  Cardiovascular: Negative for chest pain, dyspnea on exertion, leg swelling, orthopnea and palpitations.  Respiratory: Negative for cough, hemoptysis, shortness of breath and snoring.   Endocrine: Negative for heat intolerance and polyphagia.  Hematologic/Lymphatic: Negative for bleeding problem. Does not bruise/bleed easily.  Skin: Negative for flushing, nail changes, rash and suspicious lesions.  Musculoskeletal: Negative for arthritis, joint pain, muscle cramps, myalgias, neck pain and stiffness.  Gastrointestinal: Negative for abdominal pain, bowel incontinence, diarrhea and excessive appetite.  Genitourinary: Negative for decreased libido, genital sores and incomplete emptying.  Neurological: Negative for brief paralysis, focal weakness, headaches and loss of balance.  Psychiatric/Behavioral: Negative for altered mental status, depression and suicidal ideas.  Allergic/Immunologic: Negative for HIV exposure and persistent infections.    EKGs/Labs/Other Studies Reviewed:    The following studies were reviewed today:   EKG  Recent Labs: 04/17/2023: Hemoglobin 15.8; Platelets 231 03/04/2024: ALT 14; BUN 15; Creatinine, Ser 1.11; Magnesium 2.2; Potassium 4.2; Sodium 140  Recent Lipid Panel No results found for: CHOL, TRIG, HDL, CHOLHDL, VLDL, LDLCALC, LDLDIRECT  Physical Exam:    VS:  BP (!) 174/86   Pulse 70   Ht 5' 8 (1.727 m)   Wt 142 lb 9.6 oz (64.7 kg)   SpO2 98%   BMI 21.68 kg/m     Wt Readings from Last 3 Encounters:  03/04/24 142 lb (64.4 kg)  03/04/24 142 lb 9.6 oz  (64.7 kg)  05/21/23 137 lb (62.1 kg)     GEN: Well nourished, well developed in no acute distress HEENT: Normal NECK: No JVD; No carotid bruits LYMPHATICS: No lymphadenopathy CARDIAC: S1S2 noted,RRR, no murmurs, rubs, gallops RESPIRATORY:  Clear to auscultation without rales, wheezing or rhonchi  ABDOMEN: Soft, non-tender, non-distended, +bowel sounds, no guarding. EXTREMITIES: No edema, No cyanosis, no clubbing MUSCULOSKELETAL:  No deformity  SKIN: Warm and dry NEUROLOGIC:  Alert and oriented x 3, non-focal PSYCHIATRIC:  Normal affect, good insight  ASSESSMENT:    1. PAF (paroxysmal atrial fibrillation) (HCC)   2. Essential hypertension   3. Mixed hyperlipidemia   4. Coronary artery disease involving native coronary artery of native heart without angina pectoris   5. Medication management     PLAN:    Coronary Artery Disease with suspected angina - recent cath no indication for PCI. Cont current medication regimen. Contue ASA 81mg  daily, and lipitor   Atrial fibrillation - paroxysmal, has not  been taking the lopressor  due to his blood pressure being low since being placed on sildenafil.  He has had some fatigue we will like to make sure that this is not Alpers of atrial fibrillation we will get a monitor today.  He also sees Dr. Inocencio today in office. His blood pressure is elevated and he has not been on any of his beta-blockers will start Coreg  6.25 mg twice daily.  Okay to not continue with the metoprolol   CHADSvasc score is 2 patient preference declines anticoagulation.  Blood pressure hypertensive in office-will start Coreg  6.25 mg twice daily.  And being very cautious because she tells me his blood pressure is low at home so starting at this very low dose as his blood pressure today will help us  avoid medication induced hypotension.  Hyperlipidemia - continue with current statin medication.  He is agreeable to follow-up with our Pharm.D. hypertension clinic or APP in 2  to 4 weeks.  The patient is in agreement with the above plan. The patient left the office in stable condition.  The patient will follow up in 6 months or sooner if needed   Medication Adjustments/Labs and Tests Ordered: Current medicines are reviewed at length with the patient today.  Concerns regarding medicines are outlined above.  Orders Placed This Encounter  Procedures   Comp Met (CMET)   Magnesium   AMB Referral to [facility] Pharm-D   LONG TERM MONITOR (3-14 DAYS)   EKG 12-Lead   EKG 12-Lead   Meds ordered this encounter  Medications   Blood Pressure Monitoring (BLOOD PRESSURE MONITOR AUTOMAT) DEVI    Sig: Use to check blood pressure.    Dispense:  1 each    Refill:  0   carvedilol  (COREG ) 6.25 MG tablet    Sig: Take 1 tablet (6.25 mg total) by mouth 2 (two) times daily.    Dispense:  180 tablet    Refill:  3    Patient Instructions  Medication Instructions:  Your physician has recommended you make the following change in your medication: START: Coreg  6.25 mg twice daily   Please take your blood pressure daily for 2 weeks and send in a MyChart message. Please include heart rates. (One message at the end of the 2 weeks).   HOW TO TAKE YOUR BLOOD PRESSURE: Rest 5 minutes before taking your blood pressure. Don't smoke or drink caffeinated beverages for at least 30 minutes before. Take your blood pressure before (not after) you eat. Sit comfortably with your back supported and both feet on the floor (don't cross your legs). Elevate your arm to heart level on a table or a desk. Use the proper sized cuff. It should fit smoothly and snugly around your bare upper arm. There should be enough room to slip a fingertip under the cuff. The bottom edge of the cuff should be 1 inch above the crease of the elbow. Ideally, take 3 measurements at one sitting and record the average.  *If you need a refill on your cardiac medications before your next appointment, please call your  pharmacy*  Lab Work: CMET, Mag If you have labs (blood work) drawn today and your tests are completely normal, you will receive your results only by: MyChart Message (if you have MyChart) OR A paper copy in the mail If you have any lab test that is abnormal or we need to change your treatment, we will call you to review the results.  Testing/Procedures: ZIO XT- Long Term Monitor Instructions  Your physician has requested you wear a ZIO patch monitor for 14 days.  This is a single patch monitor. [employer] supplies one patch monitor per enrollment. Additional stickers are not available. Please do not apply patch if you will be having a Nuclear Stress Test,  Echocardiogram, Cardiac CT, MRI, or Chest Xray during the period you would be wearing the  monitor. The patch cannot be worn during these tests. You cannot remove and re-apply the  ZIO XT patch monitor.  Your ZIO patch monitor will be mailed 3 day USPS to your address on file. It may take 3-5 days  to receive your monitor after you have been enrolled.  Once you have received your monitor, please review the enclosed instructions. Your monitor  has already been registered assigning a specific monitor serial # to you.  Billing and Patient Assistance Program Information  We have supplied [employer] with any of your insurance information on file for billing purposes. [employer] offers a sliding scale Patient Assistance Program for patients that do not have  insurance, or whose insurance does not completely cover the cost of the ZIO monitor.  You must apply for the Patient Assistance Program to qualify for this discounted rate.  To apply, please call [employer] at [phone], select option 4, select option 2, ask to apply for  Patient Assistance Program. Meredeth will ask your household income, and how many people  are in your household. They will quote your out-of-pocket cost based on that information.  [employer] will also be able to set up a  56-month, interest-free payment plan if needed.  Applying the monitor   Shave hair from upper left chest.  Hold abrader disc by orange tab. Rub abrader in 40 strokes over the upper left chest as  indicated in your monitor instructions.  Clean area with 4 enclosed alcohol pads. Let dry.  Apply patch as indicated in monitor instructions. Patch will be placed under collarbone on left  side of chest with arrow pointing upward.  Rub patch adhesive wings for 2 minutes. Remove white label marked 1. Remove the white  label marked 2. Rub patch adhesive wings for 2 additional minutes.  While looking in a mirror, press and release button in center of patch. A small green light will  flash 3-4 times. This will be your only indicator that the monitor has been turned on.  Do not shower for the first 24 hours. You may shower after the first 24 hours.  Press the button if you feel a symptom. You will hear a small click. Record Date, Time and  Symptom in the Patient Logbook.  When you are ready to remove the patch, follow instructions on the last 2 pages of Patient  Logbook. Stick patch monitor onto the last page of Patient Logbook.  Place Patient Logbook in the blue and white box. Use locking tab on box and tape box closed  securely. The blue and white box has prepaid postage on it. Please place it in the mailbox as  soon as possible. Your physician should have your test results approximately 7 days after the  monitor has been mailed back to [facility].  Call [facility] Customer Care at [phone] if you have questions regarding  your ZIO XT patch monitor. Call them immediately if you see an orange light blinking on your  monitor.  If your monitor falls off in less than 4 days, contact our Monitor department at [phone].  If your monitor becomes loose or falls off after  4 days call [employer] at [phone] for  suggestions on securing your monitor   Follow-Up: At [facility], you and your health needs are our priority.  As part of our continuing mission to provide you with exceptional heart care, our providers are all part of one team.  This team includes your primary Cardiologist (physician) and Advanced Practice Providers or APPs (Physician Assistants and Nurse Practitioners) who all work together to provide you with the care you need, when you need it.  Your next appointment:   2-4 week with an APP or Pharm-D  then 6 month(s)  Provider:   Jermar Colter, DO     Other Instructions Please see our pharmacy staff or an APP in 2-4 weeks (blood pressure follow up).            Adopting a Healthy Lifestyle.  Know what a healthy weight is for you (roughly BMI <25) and aim to maintain this   Aim for 7+ servings of fruits and vegetables daily   65-80+ fluid ounces of water or unsweet tea for healthy kidneys   Limit to max 1 drink of alcohol per day; avoid smoking/tobacco   Limit animal fats in diet for cholesterol and heart health - choose grass fed whenever available   Avoid highly processed foods, and foods high in saturated/trans fats   Aim for low stress - take time to unwind and care for your mental health   Aim for 150 min of moderate intensity exercise weekly for heart health, and weights twice weekly for bone health   Aim for 7-9 hours of sleep daily   When it comes to diets, agreement about the perfect plan isnt easy to find, even among the experts. Experts at the [facility] [employer] developed an idea known as the Healthy Eating Plate. Just imagine a plate divided into logical, healthy portions.   The emphasis is on diet quality:   Load up on vegetables and fruits - one-half of your plate: Aim for color and variety, and remember that potatoes dont count.   Go for whole grains - one-quarter of your plate: Whole wheat, barley, wheat berries, quinoa, oats, brown rice, and foods made with them. If you want pasta, go with  whole wheat pasta.   Protein power - one-quarter of your plate: Fish, chicken, beans, and nuts are all healthy, versatile protein sources. Limit red meat.   The diet, however, does go beyond the plate, offering a few other suggestions.   Use healthy plant oils, such as olive, canola, soy, corn, sunflower and peanut. Check the labels, and avoid partially hydrogenated oil, which have unhealthy trans fats.   If youre thirsty, drink water. Coffee and tea are good in moderation, but skip sugary drinks and limit milk and dairy products to one or two daily servings.   The type of carbohydrate in the diet is more important than the amount. Some sources of carbohydrates, such as vegetables, fruits, whole grains, and beans-are healthier than others.   Finally, stay active  Signed, Dub Huntsman, DO  03/06/2024 9:04 AM    [facility]

## 2024-03-04 NOTE — Patient Instructions (Signed)
 Medication Instructions:  Your physician recommends that you continue on your current medications as directed. Please refer to the Current Medication list given to you today.  *If you need a refill on your cardiac medications before your next appointment, please call your pharmacy*   Follow-Up: At [facility], you and your health needs are our priority.  As part of our continuing mission to provide you with exceptional heart care, our providers are all part of one team.  This team includes your primary Cardiologist (physician) and Advanced Practice Providers or APPs (Physician Assistants and Nurse Practitioners) who all work together to provide you with the care you need, when you need it.  Your next appointment:   As needed with Dr. Lawana Pray

## 2024-03-05 ENCOUNTER — Ambulatory Visit: Payer: Self-pay | Admitting: Cardiology

## 2024-03-05 LAB — COMPREHENSIVE METABOLIC PANEL WITH GFR
ALT: 14 IU/L (ref 0–44)
AST: 14 IU/L (ref 0–40)
Albumin: 4 g/dL (ref 3.9–4.9)
Alkaline Phosphatase: 108 IU/L (ref 47–123)
BUN/Creatinine Ratio: 14 (ref 10–24)
BUN: 15 mg/dL (ref 8–27)
Bilirubin Total: 0.6 mg/dL (ref 0.0–1.2)
CO2: 22 mmol/L (ref 20–29)
Calcium: 9.3 mg/dL (ref 8.6–10.2)
Chloride: 102 mmol/L (ref 96–106)
Creatinine, Ser: 1.11 mg/dL (ref 0.76–1.27)
Globulin, Total: 2.8 g/dL (ref 1.5–4.5)
Glucose: 93 mg/dL (ref 70–99)
Potassium: 4.2 mmol/L (ref 3.5–5.2)
Sodium: 140 mmol/L (ref 134–144)
Total Protein: 6.8 g/dL (ref 6.0–8.5)
eGFR: 71 mL/min/1.73 (ref >59)

## 2024-03-05 LAB — MAGNESIUM: Magnesium: 2.2 mg/dL (ref 1.6–2.3)

## 2024-03-12 ENCOUNTER — Encounter (INDEPENDENT_AMBULATORY_CARE_PROVIDER_SITE_OTHER): Payer: Self-pay

## 2024-03-16 DIAGNOSIS — J310 Chronic rhinitis: Secondary | ICD-10-CM | POA: Diagnosis not present

## 2024-03-26 DIAGNOSIS — L570 Actinic keratosis: Secondary | ICD-10-CM | POA: Diagnosis not present

## 2024-03-26 DIAGNOSIS — I480 Paroxysmal atrial fibrillation: Secondary | ICD-10-CM | POA: Diagnosis not present

## 2024-03-26 DIAGNOSIS — L820 Inflamed seborrheic keratosis: Secondary | ICD-10-CM | POA: Diagnosis not present

## 2024-03-26 DIAGNOSIS — L578 Other skin changes due to chronic exposure to nonionizing radiation: Secondary | ICD-10-CM | POA: Diagnosis not present

## 2024-03-26 DIAGNOSIS — D225 Melanocytic nevi of trunk: Secondary | ICD-10-CM | POA: Diagnosis not present

## 2024-03-26 DIAGNOSIS — L814 Other melanin hyperpigmentation: Secondary | ICD-10-CM | POA: Diagnosis not present

## 2024-03-30 ENCOUNTER — Encounter (INDEPENDENT_AMBULATORY_CARE_PROVIDER_SITE_OTHER): Payer: Self-pay

## 2024-04-01 DIAGNOSIS — I480 Paroxysmal atrial fibrillation: Secondary | ICD-10-CM | POA: Diagnosis not present

## 2024-04-06 NOTE — Progress Notes (Unsigned)
 Office Visit    Patient Name: ANSEL FERRALL Date of Encounter: 04/07/2024  Primary Care Provider:  Elaine Garnette BIRCH., MD Primary Cardiologist:  Kardie Tobb, DO  Chief Complaint    Hypertension  Significant Past Medical History   PAF Ablation 11/29/2020  HLD Managed with Atorvastatin 10 mg  CAD LAD disease on his coronary CTA, 50% stenosis not restricting blood flow, no intervention needed    No Known Allergies  History of Present Illness    SYD NEWSOME is a 68 y.o. male patient of Dr Sheena, in the office today for hypertension evaluation. He has a history of praoxysmal atrial fibrillation, hyperlipidemia, and CAD. Post ablation the patient was prescribed Eliquis , however patient stopped the medication due to preference, despite having a CHADS-VASC score of 3. He was recently started on sildenafil by his PCP and noticed a decrease in BP. Due to the drop in BP he stopped taking metoprolol . During his visit with Dr Sheena he was started on carvedilol  6.25 mg BID.   CHA2DS2-VASc Score = 3   CHF History: 0 HTN History: 1 Diabetes History: 0 Stroke History: 0 Vascular Disease History: 1 Age Score: 1 Gender Score: 0  Blood Pressure Goal:  130/80  Current Medications:  carvedilol  6.25 mg BID, sildenafil 100 mg daily  Previously tried:  metoprolol  tartrate - switched to succinate; metoprolol  succinate - low blood pressure  Family Hx:    Mother - Skin cancer Father - Prostate cancer Sister: Congestive HF, had recent heart transplant Lives with grandson - 57 years old  Social Hx:      Tobacco: never  Alcohol: very rarely  Caffeine: coffee with creamer every morning Diet:   Eats out most day at sit down restaurants with a variety of protein and vegetables.  Eat mainly breakfast and dinner, sometimes lunch. Meals mainly consist of salads with grilled chicken or a hamburger. Eats grits and egg for breakfast.   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  Home BP readings:     18 reading  morning BP average: 130/67 mmHg, 74 pulse (range: 109-162 / 57-74 mmHg) Evening BP average: 142/69 mmHg, 74 pulse (range: 111-173 / 57-76 mmHg)  Adherence Assessment  Do you ever forget to take your medication? [] Yes [x] No  Do you ever skip doses due to side effects? [] Yes [x] No  Do you have trouble affording your medicines? [] Yes [x] No  Are you ever unable to pick up your medication due to transportation difficulties? [] Yes [x] No  Do you ever stop taking your medications because you don't believe they are helping? [] Yes [x] No  Do you check your weight daily? [] Yes [x] No       Accessory Clinical Findings    Lab Results  Component Value Date   CREATININE 1.11 03/04/2024   BUN 15 03/04/2024   NA 140 03/04/2024   K 4.2 03/04/2024   CL 102 03/04/2024   CO2 22 03/04/2024   Lab Results  Component Value Date   ALT 14 03/04/2024   AST 14 03/04/2024   ALKPHOS 108 03/04/2024   BILITOT 0.6 03/04/2024   No results found for: HGBA1C  Home Medications    Current Outpatient Medications  Medication Sig Dispense Refill   carvedilol  (COREG ) 12.5 MG tablet Take 1 tablet (12.5 mg total) by mouth 2 (two) times daily. 180 tablet 3   acetaminophen  (TYLENOL ) 500 MG tablet Take  1,000 mg by mouth every 8 (eight) hours as needed for moderate pain (pain score 4-6).     aspirin  EC 81 MG tablet Take 81 mg by mouth daily. Swallow whole.     atorvastatin (LIPITOR) 10 MG tablet Take 10 mg by mouth daily.     ibuprofen (ADVIL) 200 MG tablet Take 400 mg by mouth every 6 (six) hours as needed for headache or moderate pain.     sildenafil (VIAGRA) 100 MG tablet Take 100 mg by mouth daily.     No current facility-administered medications for this visit.        Assessment & Plan    Hypertension  Assessment:  - Office BP reading 128/66 mmHg, at goal 130/80 mmHg  - Home BP reading avg 130/67 mmHg in  the morning and 142/69 mmHg in evening  - BP at home is still elevated in the evening   - Currently taking carvedilol  6.25 mg BID and sildenafil 100 mg daily. Well tolerated with no adverse effects  - No palpitation  - Answered any question the patient had   Plan:  - Increased the carvedilol  to 12.5 mg BID. Patient has two month supply left of the 6.25 mg and will take two tablet twice daily until he runs out. Then start the 12.5 mg 1 tablet twice a day.  - Continue to keep a BP log at home and bring them to the next visit.   - Will follow up in December on the the 23rd.    Shirlee GORMAN Langdon, PharmD Student [facility]  [address] [place], [place] [address] [phone]  I was with student and patient for entire visit and agree with above assessment and plan.  Geovani Tootle PharmD [facility]

## 2024-04-07 ENCOUNTER — Encounter: Payer: Self-pay | Admitting: Pharmacist Clinician (PhC)/ Clinical Pharmacy Specialist

## 2024-04-07 ENCOUNTER — Ambulatory Visit: Attending: Cardiology | Admitting: Pharmacist Clinician (PhC)/ Clinical Pharmacy Specialist

## 2024-04-07 VITALS — BP 128/66

## 2024-04-07 DIAGNOSIS — I10 Essential (primary) hypertension: Secondary | ICD-10-CM | POA: Diagnosis not present

## 2024-04-07 MED ORDER — CARVEDILOL 12.5 MG PO TABS: 12.5000 mg | ORAL_TABLET | Freq: Two times a day (BID) | ORAL | 3 refills | Status: DC

## 2024-04-07 NOTE — Patient Instructions (Signed)
 Follow up appointment: December 23 at 3:15 pm  Take your BP meds as follows: INCREASE CARVEDILOL  TO 12.5 MG TWICE DAILY  Check your blood pressure at home TWICE DAILY and keep record of the readings.  Your blood pressure goal is < 130/80  To check your pressure at home you will need to:  1. Sit up in a chair, with feet flat on the floor and back supported. Do not cross your ankles or legs. 2. Rest your left arm so that the cuff is about heart level. If the cuff goes on your upper arm,  then just relax the arm on the table, arm of the chair or your lap. If you have a wrist cuff, we  suggest relaxing your wrist against your chest (think of it as Pledging the Flag with the  wrong arm).  3. Place the cuff snugly around your arm, about 1 inch above the crook of your elbow. The  cords should be inside the groove of your elbow.  4. Sit quietly, with the cuff in place, for about 5 minutes. After that 5 minutes press the power  button to start a reading. 5. Do not talk or move while the reading is taking place.  6. Record your readings on a sheet of paper. Although most cuffs have a memory, it is often  easier to see a pattern developing when the numbers are all in front of you.  7. You can repeat the reading after 1-3 minutes if it is recommended  Make sure your bladder is empty and you have not had caffeine or tobacco within the last 30 min  Always bring your blood pressure log with you to your appointments. If you have not brought your monitor in to be double checked for accuracy, please bring it to your next appointment.  You can find a list of quality blood pressure cuffs at [email or web address].no  Important lifestyle changes to control high blood pressure  Intervention  Effect on the BP  Lose extra pounds and watch your waistline Weight loss is one of the most effective lifestyle changes for controlling blood pressure. If you're overweight or obese, losing even a small amount of weight can  help reduce blood pressure. Blood pressure might go down by about 1 millimeter of mercury (mm Hg) with each kilogram (about 2.2 pounds) of weight lost.  Exercise regularly As a general goal, aim for at least 30 minutes of moderate physical activity every day. Regular physical activity can lower high blood pressure by about 5 to 8 mm Hg.  Eat a healthy diet Eating a diet rich in whole grains, fruits, vegetables, and low-fat dairy products and low in saturated fat and cholesterol. A healthy diet can lower high blood pressure by up to 11 mm Hg.  Reduce salt (sodium) in your diet Even a small reduction of sodium in the diet can improve heart health and reduce high blood pressure by about 5 to 6 mm Hg.  Limit alcohol One drink equals 12 ounces of beer, 5 ounces of wine, or 1.5 ounces of 80-proof liquor.  Limiting alcohol to less than one drink a day for women or two drinks a day for men can help lower blood pressure by about 4 mm Hg.   If you have any questions or concerns please use My Chart to send questions or call the office at [phone]

## 2024-04-15 ENCOUNTER — Encounter (INDEPENDENT_AMBULATORY_CARE_PROVIDER_SITE_OTHER): Payer: Self-pay

## 2024-04-15 ENCOUNTER — Ambulatory Visit (INDEPENDENT_AMBULATORY_CARE_PROVIDER_SITE_OTHER)

## 2024-04-15 VITALS — BP 157/79 | HR 62 | Wt 142.0 lb

## 2024-04-15 DIAGNOSIS — J342 Deviated nasal septum: Secondary | ICD-10-CM

## 2024-04-15 DIAGNOSIS — J329 Chronic sinusitis, unspecified: Secondary | ICD-10-CM

## 2024-04-15 DIAGNOSIS — J339 Nasal polyp, unspecified: Secondary | ICD-10-CM | POA: Diagnosis not present

## 2024-04-15 DIAGNOSIS — J3489 Other specified disorders of nose and nasal sinuses: Secondary | ICD-10-CM

## 2024-04-15 MED ORDER — AZELASTINE HCL 0.1 % NA SOLN: 2.0000 | Freq: Two times a day (BID) | NASAL | 12 refills | Status: DC

## 2024-04-15 NOTE — Progress Notes (Signed)
 HPI:   Discussed the use of AI scribe software for clinical note transcription with the patient, who gave verbal consent to proceed.  History of Present Illness Christopher Duarte is a 68 year old male who presents with chronic nasal obstruction and difficulty breathing.  He has experienced difficulty breathing through his nose for an extended period, initially attributing it to wearing a mask during the COVID pandemic. The issue has persisted and possibly worsened over time, particularly affecting his ability to take a deep breath at night, which disrupts his sleep. He describes an inability to get a good breath of air through his nose. No pain or pressure in the face, but he feels fatigued during the day due to inadequate breathing. He reports bilateral nasal blockage without significant drainage, although he occasionally experiences a runny nose in the winter. There is no history of trauma or surgery on his nose.  He has a history of atrial fibrillation for which he underwent an ablation, resulting in significant improvement. He is not on blood thinners post-ablation. No history of asthma or lung issues, and he has not seen a pulmonologist, although various x-rays have been performed.  He has no recent change in taste or smell. He wears a dental plate, which occasionally causes pain, but he manages this by removing it when necessary.  His family history includes substance abuse, with his father being an alcoholic and his son having passed away from a fentanyl  overdose. He mentions a difficult upbringing with his mother passing away when he was young and his father struggling with alcoholism.   PMH/Meds/All/SocHx/FamHx/ROS: Past Medical History:  Diagnosis Date   Anemia 01/02/2013   Anxiety 07/02/2013   Arrhythmia    Benign prostatic hyperplasia 01/04/2013   Depression 01/02/2013   Hearing loss    wears aids   Hyperlipidemia    Hypertension    Kidney stones 01/02/2013   Major depression     Palpitation 01/02/2013   Prostatitis 01/02/2013   Pyelonephritis 01/02/2013   Past Surgical History:  Procedure Laterality Date   ATRIAL FIBRILLATION ABLATION N/A 11/29/2020   Procedure: ATRIAL FIBRILLATION ABLATION;  Surgeon: Inocencio Soyla Lunger, MD;  Location: [facility] INVASIVE CV LAB;  Service: Cardiovascular;  Laterality: N/A;   CORONARY PRESSURE/FFR STUDY N/A 04/23/2023   Procedure: CORONARY PRESSURE/FFR STUDY;  Surgeon: Wonda Sharper, MD;  Location: [facility] INVASIVE CV LAB;  Service: Cardiovascular;  Laterality: N/A;   LEFT HEART CATH AND CORONARY ANGIOGRAPHY N/A 04/23/2023   Procedure: LEFT HEART CATH AND CORONARY ANGIOGRAPHY;  Surgeon: Wonda Sharper, MD;  Location: [facility] INVASIVE CV LAB;  Service: Cardiovascular;  Laterality: N/A;   No family history of bleeding disorders, wound healing problems or difficulty with anesthesia.  Social Connections: Not on file    Current Outpatient Medications:    acetaminophen  (TYLENOL ) 500 MG tablet, Take 1,000 mg by mouth every 8 (eight) hours as needed for moderate pain (pain score 4-6)., Disp: , Rfl:    aspirin  EC 81 MG tablet, Take 81 mg by mouth daily. Swallow whole., Disp: , Rfl:    atorvastatin (LIPITOR) 10 MG tablet, Take 10 mg by mouth daily., Disp: , Rfl:    carvedilol  (COREG ) 12.5 MG tablet, Take 1 tablet (12.5 mg total) by mouth 2 (two) times daily., Disp: 180 tablet, Rfl: 3   ibuprofen (ADVIL) 200 MG tablet, Take 400 mg by mouth every 6 (six) hours as needed for headache or moderate pain., Disp: , Rfl:    sildenafil (VIAGRA) 100 MG tablet, Take 100  mg by mouth daily., Disp: , Rfl:  A complete ROS was performed with pertinent positives/negatives noted in the HPI. The remainder of the ROS are negative.   Physical Exam:  BP (!) 157/79 (BP Location: Left Arm, Patient Position: Sitting, Cuff Size: Normal) Comment: white coat  Pulse 62   Wt 142 lb (64.4 kg)   SpO2 97%   BMI 21.59 kg/m  General: Well developed, well nourished. No acute  distress.  Head/Face: Normocephalic. No sinus tenderness. Facial nerve intact and equal bilaterally. No facial lacerations. Eyes: PERRL, no scleral icterus or conjunctival hemorrhage. EOMI. Ears: No gross deformity. Normal external canal. Tympanic membrane in tact bilaterally Hearing: Normal speech reception.  Nose: No gross deformity or lesions. No purulent discharge. Bilateral turbinate hypertrophy. S-shaped nasal septal deviation. Narrow nasal passages bilateral R>L Mouth/Oropharynx: Lips without any lesions. Dentition good. No mucosal lesions within the oropharynx. No tonsillar enlargement, exudate, or lesions. Pharyngeal walls symmetrical. Uvula midline. Tongue midline without lesions. Larynx: See TFL if applicable Nasopharynx: See TFL if applicable Neck: Trachea midline. No masses. No thyromegaly or nodules palpated. No crepitus. Lymphatic: No lymphadenopathy in the neck. Respiratory: No stridor or distress. Room air. Cardiovascular: Regular rate and rhythm. Extremities: No edema or cyanosis. Warm and well-perfused. Skin: No scars or lesions on face or neck. Neurologic: CN II-XII grossly intact. Moving all extremities without gross abnormality. Other:  Independent Review of Additional Tests or Records: None  Procedures: Bilateral Diagnostic Rigid Nasal Endoscopy  Pre-procedure diagnosis: Concern for sinusitis Post-procedure diagnosis: same Indication: See pre-procedure diagnosis and physical exam above Complications: None apparent Anesthesia: Lidocaine  4% and topical decongestant was topically sprayed in each nasal cavity  Description of Procedure:  Patient was identified. A flexible endoscope was utilized to evaluate the sinonasal cavities, mucosa, sinus ostia and turbinates and septum.  Overall, signs of mucosal inflammation are noted.  Also noted is a polypoid mass in the right middle meatus with no associated purulence. Right Middle meatus: polypoid changes of the uncinate  without purulence noted Right SE Recess: no purulence or polyps Left MM: no purulence or polyps Left SE Recess: no purulence or polyps  CPT CODE -- 68768 - Mod 25  Impression & Plans: Assessment & Plan Deviated nasal septum with bilateral nasal obstruction and right nasal polyp - Nasal endoscopy performed in clinic today - Continue Flonase nasal spray daily - Start azelastine nasal spray daily or BID if needed - Ordered CT scan of the sinuses in 4 weeks. - Scheduled follow-up after CT scan to discuss results and surgical options if necessary   Follow-up in 4-6 weeks.  Adah Malkin, DO [facility]

## 2024-05-12 ENCOUNTER — Encounter (INDEPENDENT_AMBULATORY_CARE_PROVIDER_SITE_OTHER): Payer: Self-pay

## 2024-05-12 ENCOUNTER — Ambulatory Visit (INDEPENDENT_AMBULATORY_CARE_PROVIDER_SITE_OTHER)

## 2024-05-13 ENCOUNTER — Other Ambulatory Visit: Payer: Self-pay

## 2024-05-13 MED ORDER — LOSARTAN POTASSIUM 100 MG PO TABS: 100.0000 mg | ORAL_TABLET | Freq: Every day | ORAL | 3 refills | Status: DC

## 2024-05-13 MED ORDER — DILTIAZEM HCL ER COATED BEADS 180 MG PO CP24: 180.0000 mg | ORAL_CAPSULE | Freq: Every day | ORAL | 3 refills | Status: DC

## 2024-05-13 MED ORDER — DILTIAZEM HCL ER COATED BEADS 180 MG PO CP24: 180.0000 mg | ORAL_CAPSULE | Freq: Every day | ORAL | 3 refills | Status: AC

## 2024-05-13 NOTE — Progress Notes (Signed)
 Cardizem  180 mg sent to preferred pharmacy. Losartan 100 mg once daily added to medication list.

## 2024-05-13 NOTE — Progress Notes (Addendum)
 Cardizem  180 mg once daily sent to [facility], Losartan 100 mg once daily added to med list.

## 2024-05-13 NOTE — Addendum Note (Signed)
 Addended by: MELIDA ROLIN HERO on: 05/13/2024 09:55 AM   Modules accepted: Orders

## 2024-05-13 NOTE — Addendum Note (Signed)
 Addended by: MELIDA ROLIN HERO on: 05/13/2024 09:50 AM   Modules accepted: Orders

## 2024-05-20 ENCOUNTER — Ambulatory Visit (HOSPITAL_COMMUNITY)
Admission: RE | Admit: 2024-05-20 | Discharge: 2024-05-20 | Disposition: A | Discharge status: Home / Self Care | Source: Ambulatory Visit

## 2024-05-20 DIAGNOSIS — J3489 Other specified disorders of nose and nasal sinuses: Secondary | ICD-10-CM | POA: Diagnosis present

## 2024-05-20 DIAGNOSIS — J329 Chronic sinusitis, unspecified: Secondary | ICD-10-CM | POA: Insufficient documentation

## 2024-05-28 ENCOUNTER — Ambulatory Visit (INDEPENDENT_AMBULATORY_CARE_PROVIDER_SITE_OTHER): Admitting: Otolaryngology

## 2024-06-01 ENCOUNTER — Encounter: Payer: Self-pay | Admitting: Pharmacist Clinician (PhC)/ Clinical Pharmacy Specialist

## 2024-06-01 ENCOUNTER — Ambulatory Visit: Attending: Cardiology | Admitting: Pharmacist Clinician (PhC)/ Clinical Pharmacy Specialist

## 2024-06-01 VITALS — BP 140/72 | HR 66

## 2024-06-01 DIAGNOSIS — I10 Essential (primary) hypertension: Secondary | ICD-10-CM | POA: Diagnosis not present

## 2024-06-01 MED ORDER — OLMESARTAN MEDOXOMIL 40 MG PO TABS: 40.0000 mg | ORAL_TABLET | Freq: Every day | ORAL | 3 refills | Status: AC

## 2024-06-01 NOTE — Patient Instructions (Signed)
 Follow up appointment: with Dr. Sheena in March (please schedule)  I will reach out to you via MyChart around the end of January to see what your home BP readings are.    Go to the lab 2 weeks after starting the olmesartan   Take your BP meds as follows:  STOP LOSARTAN .  THEN START OLMESARTAN  40 MG ONCE DAILY  Check your blood pressure at home twice daily about 3 days per week and keep record of the readings.  Your blood pressure goal is < 130/80  To check your pressure at home you will need to:  1. Sit up in a chair, with feet flat on the floor and back supported. Do not cross your ankles or legs. 2. Rest your left arm so that the cuff is about heart level. If the cuff goes on your upper arm,  then just relax the arm on the table, arm of the chair or your lap. If you have a wrist cuff, we  suggest relaxing your wrist against your chest (think of it as Pledging the Flag with the  wrong arm).  3. Place the cuff snugly around your arm, about 1 inch above the crook of your elbow. The  cords should be inside the groove of your elbow.  4. Sit quietly, with the cuff in place, for about 5 minutes. After that 5 minutes press the power  button to start a reading. 5. Do not talk or move while the reading is taking place.  6. Record your readings on a sheet of paper. Although most cuffs have a memory, it is often  easier to see a pattern developing when the numbers are all in front of you.  7. You can repeat the reading after 1-3 minutes if it is recommended  Make sure your bladder is empty and you have not had caffeine or tobacco within the last 30 min  Always bring your blood pressure log with you to your appointments. If you have not brought your monitor in to be double checked for accuracy, please bring it to your next appointment.  You can find a list of quality blood pressure cuffs at [email or web address]  Important lifestyle changes to control high blood pressure  Intervention  Effect on the  BP  Lose extra pounds and watch your waistline Weight loss is one of the most effective lifestyle changes for controlling blood pressure. If you're overweight or obese, losing even a small amount of weight can help reduce blood pressure. Blood pressure might go down by about 1 millimeter of mercury (mm Hg) with each kilogram (about 2.2 pounds) of weight lost.  Exercise regularly As a general goal, aim for at least 30 minutes of moderate physical activity every day. Regular physical activity can lower high blood pressure by about 5 to 8 mm Hg.  Eat a healthy diet Eating a diet rich in whole grains, fruits, vegetables, and low-fat dairy products and low in saturated fat and cholesterol. A healthy diet can lower high blood pressure by up to 11 mm Hg.  Reduce salt (sodium) in your diet Even a small reduction of sodium in the diet can improve heart health and reduce high blood pressure by about 5 to 6 mm Hg.  Limit alcohol One drink equals 12 ounces of beer, 5 ounces of wine, or 1.5 ounces of 80-proof liquor.  Limiting alcohol to less than one drink a day for women or two drinks a day for men can help lower blood pressure by about  4 mm Hg.   If you have any questions or concerns please use My Chart to send questions or call the office at [phone]

## 2024-06-01 NOTE — Assessment & Plan Note (Signed)
 Assessment: BP is uncontrolled in office BP 140/72 mmHg;  above the goal (<130/80). Home AM average WNL, PM readings still elevated Tolerates diltiazem  and losartan  well, without any side effects Denies SOB, palpitation, chest pain, headaches,or swelling Reiterated the importance of regular exercise and low salt diet   Plan:  Stop taking losartan  100 mg Start taking olmesartan  40 mg daily Continue taking diltiazem  180 mg daily Patient to keep record of BP readings with heart rate and report to us  at the next visit Patient to follow up with Dr. Sheena in March  Labs ordered today:  BMET 2 weeks after switching to olmesartan 

## 2024-06-01 NOTE — Progress Notes (Signed)
 "   Office Visit    Patient Name: Christopher Duarte Date of Encounter: 06/01/2024  Primary Care Provider:  Elaine Garnette BIRCH., MD Primary Cardiologist:  Kardie Tobb, DO  Chief Complaint    Hypertension  Significant Past Medical History   PAF Ablation 11/29/2020  HLD Managed with Atorvastatin 10 mg  CAD LAD disease on his coronary CTA, 50% stenosis not restricting blood flow, no intervention needed    Allergies[1]  History of Present Illness    Christopher Duarte is a 68 y.o. male patient of Dr Sheena, in the office today for hypertension evaluation. He has a history of praoxysmal atrial fibrillation, hyperlipidemia, and CAD. Post ablation the patient was prescribed Eliquis , however patient stopped the medication due to preference, despite having a CHADS-VASC score of 3. He was recently started on sildenafil by his PCP and noticed a decrease in BP. Due to the drop in BP he stopped taking metoprolol . During his visit with Dr Sheena he was started on carvedilol  6.25 mg BID.   When I saw him in October I increased the carvedilol  from 6.25 to 12.5 mg twice daily.   A few days after that his PCP started him on losartan  100 mg daily.  Early in December Dr. Sheena reached out to him and switched carvedilol  to diltiazem  for better AF control.    He returns today, has been on diltiazem  and losartan  for about 3 weeks now.  Has noted that morning BP readings have started to improve, but evening numbers still elevated.    Blood Pressure Goal:  130/80  Current Medications:  diltiazem  180 mg daily, losartan  100 mg daily  Previously tried:  carvedilol  - switched to diltiazem  for AF  Metoprolol  caused hypotensive episode  Family Hx:    Mother - Skin cancer Father - Prostate cancer Sister: Congestive HF, had recent heart transplant Lives with grandson - 55 years old  Social Hx:      Tobacco: never  Alcohol: very rarely  Caffeine: coffee with creamer every morning Diet:   Eats out most day at sit down  restaurants with a variety of protein and vegetables.  Eat mainly breakfast and dinner, sometimes lunch. Meals mainly consist of salads with grilled chicken or a hamburger. Eats grits and egg for breakfast.   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  Home BP readings:  Past 2 weeks  AM average (12 readings) - 129/65 HR 74  (range 108-137/56-69) prev avg 130/67 PM average (9 readings)  - 139/63  HR 78 (range 120-156/55-73) prev avg 142/69  Adherence Assessment  Do you ever forget to take your medication? [] Yes [x] No  Do you ever skip doses due to side effects? [] Yes [x] No  Do you have trouble affording your medicines? [] Yes [x] No  Are you ever unable to pick up your medication due to transportation difficulties? [] Yes [x] No  Do you ever stop taking your medications because you don't believe they are helping? [] Yes [x] No  Do you check your weight daily? [] Yes [x] No    Accessory Clinical Findings    Lab Results  Component Value Date   CREATININE 1.11 03/04/2024   BUN 15 03/04/2024   NA 140 03/04/2024   K 4.2 03/04/2024   CL 102 03/04/2024   CO2 22 03/04/2024   Lab Results  Component Value Date   ALT 14 03/04/2024   AST 14 03/04/2024  ALKPHOS 108 03/04/2024   BILITOT 0.6 03/04/2024   No results found for: HGBA1C  Home Medications    Current Outpatient Medications  Medication Sig Dispense Refill   acetaminophen  (TYLENOL ) 500 MG tablet Take 1,000 mg by mouth every 8 (eight) hours as needed for moderate pain (pain score 4-6).     atorvastatin (LIPITOR) 10 MG tablet Take 10 mg by mouth daily.     diltiazem  (CARDIZEM  CD) 180 MG 24 hr capsule Take 1 capsule (180 mg total) by mouth daily. 90 capsule 3   ibuprofen (ADVIL) 200 MG tablet Take 400 mg by mouth every 6 (six) hours as needed for headache or moderate pain.     olmesartan  (BENICAR ) 40 MG tablet Take 1 tablet (40 mg total) by mouth daily. 30  tablet 3   sildenafil (VIAGRA) 100 MG tablet Take 100 mg by mouth daily.     No current facility-administered medications for this visit.      Assessment & Plan    Hypertension Assessment: BP is uncontrolled in office BP 140/72 mmHg;  above the goal (<130/80). Home AM average WNL, PM readings still elevated Tolerates diltiazem  and losartan  well, without any side effects Denies SOB, palpitation, chest pain, headaches,or swelling Reiterated the importance of regular exercise and low salt diet   Plan:  Stop taking losartan  100 mg Start taking olmesartan  40 mg daily Continue taking diltiazem  180 mg daily Patient to keep record of BP readings with heart rate and report to us  at the next visit Patient to follow up with Dr. Sheena in March  Labs ordered today:  BMET 2 weeks after switching to olmesartan    Allean Mink PharmD CPP [facility]  [address] [place], [place] [address] [phone]          [1] No Known Allergies  "

## 2024-06-22 ENCOUNTER — Encounter (INDEPENDENT_AMBULATORY_CARE_PROVIDER_SITE_OTHER): Payer: Self-pay | Admitting: Otolaryngology

## 2024-06-22 ENCOUNTER — Ambulatory Visit (INDEPENDENT_AMBULATORY_CARE_PROVIDER_SITE_OTHER): Admitting: Otolaryngology

## 2024-06-22 VITALS — BP 106/65 | HR 68 | Ht 68.0 in | Wt 130.0 lb

## 2024-06-22 DIAGNOSIS — J329 Chronic sinusitis, unspecified: Secondary | ICD-10-CM | POA: Diagnosis not present

## 2024-06-22 DIAGNOSIS — J342 Deviated nasal septum: Secondary | ICD-10-CM | POA: Diagnosis not present

## 2024-06-22 DIAGNOSIS — J343 Hypertrophy of nasal turbinates: Secondary | ICD-10-CM

## 2024-06-22 LAB — BASIC METABOLIC PANEL WITH GFR
BUN/Creatinine Ratio: 12 (ref 10–24)
BUN: 11 mg/dL (ref 8–27)
CO2: 19 mmol/L — ABNORMAL LOW (ref 20–29)
Calcium: 9.4 mg/dL (ref 8.6–10.2)
Chloride: 101 mmol/L (ref 96–106)
Creatinine, Ser: 0.93 mg/dL (ref 0.76–1.27)
Glucose: 125 mg/dL — ABNORMAL HIGH (ref 70–99)
Potassium: 3.8 mmol/L (ref 3.5–5.2)
Sodium: 138 mmol/L (ref 134–144)
eGFR: 88 mL/min/1.73

## 2024-06-22 NOTE — Progress Notes (Signed)
 Reason for Consult: Nasal obstruction Referring Physician: Dr. Elaine Lamar LITTIE Christopher Duarte is an 69 y.o. male.  HPI: History of nasal obstruction and now wants to proceed with surgery because he does not want to continue to use medications.  He has had workup with CT scan.  He has had treatment with nasal steroid sprays, antihistamines, and antibiotics.  He has also had prednisone.  He continues with a nasal obstruction.  His CT scan showed deviation of the septum to the right and a maxillary thickening bilaterally.  He has no facial pressure or pain.  Past Medical History:  Diagnosis Date   Anemia 01/02/2013   Anxiety 07/02/2013   Arrhythmia    Benign prostatic hyperplasia 01/04/2013   Depression 01/02/2013   Hearing loss    wears aids   Hyperlipidemia    Hypertension    Kidney stones 01/02/2013   Major depression    Palpitation 01/02/2013   Prostatitis 01/02/2013   Pyelonephritis 01/02/2013    Past Surgical History:  Procedure Laterality Date   ATRIAL FIBRILLATION ABLATION N/A 11/29/2020   Procedure: ATRIAL FIBRILLATION ABLATION;  Surgeon: Inocencio Soyla Lunger, MD;  Location: [facility] INVASIVE CV LAB;  Service: Cardiovascular;  Laterality: N/A;   CORONARY PRESSURE/FFR STUDY N/A 04/23/2023   Procedure: CORONARY PRESSURE/FFR STUDY;  Surgeon: Wonda Sharper, MD;  Location: [facility] INVASIVE CV LAB;  Service: Cardiovascular;  Laterality: N/A;   LEFT HEART CATH AND CORONARY ANGIOGRAPHY N/A 04/23/2023   Procedure: LEFT HEART CATH AND CORONARY ANGIOGRAPHY;  Surgeon: Wonda Sharper, MD;  Location: [facility] INVASIVE CV LAB;  Service: Cardiovascular;  Laterality: N/A;    Family History  Problem Relation Age of Onset   Skin cancer Mother    Prostate cancer Father     Social History:  reports that he has never smoked. He has never used smokeless tobacco. He reports that he does not currently use alcohol. He reports that he does not use drugs.  Allergies: Allergies[1]   Results for orders placed or  performed in visit on 06/01/24 (from the past 48 hours)  Basic metabolic panel with GFR     Status: Abnormal   Collection Time: 06/21/24  3:18 PM  Result Value Ref Range   Glucose 125 (H) 70 - 99 mg/dL   BUN 11 8 - 27 mg/dL   Creatinine, Ser 9.06 0.76 - 1.27 mg/dL   eGFR 88 >40 fO/fpw/8.26   BUN/Creatinine Ratio 12 10 - 24   Sodium 138 134 - 144 mmol/L   Potassium 3.8 3.5 - 5.2 mmol/L   Chloride 101 96 - 106 mmol/L   CO2 19 (L) 20 - 29 mmol/L   Calcium 9.4 8.6 - 10.2 mg/dL    No results found.  ROS Blood pressure 106/65, pulse 68, height 5' 8 (1.727 m), weight 130 lb (59 kg), SpO2 98%. Physical Exam Constitutional:      Appearance: Normal appearance.  HENT:     Head: Normocephalic and atraumatic.     Right Ear: Tympanic membrane is without lesions and middle ear aerated, ear canal and external ear normal.     Left Ear: Tympanic membrane is without lesions and middle ear aerated, ear canal and external ear normal.     Nose: Nose deviation of his septum to the right with a obvious large spur going into the middle meatus region.  Turbinates with mild hypertrophy, No significant swelling or masses.     Oral cavity/oropharynx: Mucous membranes are moist. No lesions or masses    Larynx: normal  voice. Mirror attempted without success    Eyes:     Extraocular Movements: Extraocular movements intact.     Conjunctiva/sclera: Conjunctivae normal.     Pupils: Pupils are equal, round, and reactive to light.  Cardiovascular:     Rate and Rhythm: Normal rate.  Pulmonary:     Effort: Pulmonary effort is normal.  Musculoskeletal:     Cervical back: Normal range of motion and neck supple. No rigidity.  Lymphadenopathy:     Cervical: No cervical adenopathy or masses.salivary glands without lesions. .     Salivary glands- no mass or swelling Neurological:     Mental Status: He is alert. CN 2-12 intact. No nystagmus      Assessment/Plan: Chronic sinusitis/deviated septum/turbinate  hypertrophy-the patient does have pathology and anatomy issues that are contributing to his obstruction issues.  We talked about more medical therapy.  He wants to proceed with fixing this and that does seem to be indicated.  We discussed septoplasty and turbinate reduction along with anterior ethmoidectomy and maxillary antrostomy.  Dr. Anice met the patient and we will proceed with the surgery and he talk to the patient as well.  The patient did not want to wait for Dr. Jacob to get back from maternity leave.  Norleen Notice 06/22/2024, 4:46 PM        [1] No Known Allergies

## 2024-06-24 ENCOUNTER — Ambulatory Visit: Payer: Self-pay | Admitting: Pharmacist Clinician (PhC)/ Clinical Pharmacy Specialist

## 2024-09-02 ENCOUNTER — Ambulatory Visit: Admitting: Cardiology

## 2024-09-03 ENCOUNTER — Ambulatory Visit (HOSPITAL_BASED_OUTPATIENT_CLINIC_OR_DEPARTMENT_OTHER): Admit: 2024-09-03

## 2024-09-03 ENCOUNTER — Encounter (HOSPITAL_BASED_OUTPATIENT_CLINIC_OR_DEPARTMENT_OTHER): Payer: Self-pay

## 2024-09-03 SURGERY — SURGERY, PARANASAL SINUS, ENDOSCOPIC, WITH NASAL SEPTOPLASTY, TURBINOPLASTY, AND MAXILLARY SINUSOTOMY
Anesthesia: General | Laterality: Bilateral
